# Patient Record
Sex: Female | Born: 1935 | Race: White | Hispanic: No | Smoking: Never smoker
Health system: Southern US, Community
[De-identification: ages and names within clinical notes are randomized; demographics above are authoritative.]

## PROBLEM LIST (undated history)

## (undated) DIAGNOSIS — K5790 Diverticulosis of intestine, part unspecified, without perforation or abscess without bleeding: Secondary | ICD-10-CM

## (undated) DIAGNOSIS — F32A Depression, unspecified: Secondary | ICD-10-CM

## (undated) DIAGNOSIS — H3320 Serous retinal detachment, unspecified eye: Secondary | ICD-10-CM

## (undated) DIAGNOSIS — F329 Major depressive disorder, single episode, unspecified: Secondary | ICD-10-CM

## (undated) DIAGNOSIS — M353 Polymyalgia rheumatica: Secondary | ICD-10-CM

## (undated) DIAGNOSIS — C50919 Malignant neoplasm of unspecified site of unspecified female breast: Secondary | ICD-10-CM

## (undated) DIAGNOSIS — M1990 Unspecified osteoarthritis, unspecified site: Secondary | ICD-10-CM

## (undated) DIAGNOSIS — J189 Pneumonia, unspecified organism: Secondary | ICD-10-CM

## (undated) DIAGNOSIS — I219 Acute myocardial infarction, unspecified: Secondary | ICD-10-CM

## (undated) HISTORY — DX: Acute myocardial infarction, unspecified: I21.9

## (undated) HISTORY — DX: Polymyalgia rheumatica: M35.3

## (undated) HISTORY — DX: Pneumonia, unspecified organism: J18.9

## (undated) HISTORY — DX: Depression, unspecified: F32.A

## (undated) HISTORY — PX: COLONOSCOPY W/ POLYPECTOMY: SHX1380

## (undated) HISTORY — PX: CHOLECYSTECTOMY: SHX55

## (undated) HISTORY — DX: Major depressive disorder, single episode, unspecified: F32.9

## (undated) HISTORY — DX: Diverticulosis of intestine, part unspecified, without perforation or abscess without bleeding: K57.90

## (undated) HISTORY — DX: Malignant neoplasm of unspecified site of unspecified female breast: C50.919

## (undated) HISTORY — DX: Unspecified osteoarthritis, unspecified site: M19.90

## (undated) HISTORY — DX: Serous retinal detachment, unspecified eye: H33.20

---

## 1975-09-10 HISTORY — PX: PARTIAL GASTRECTOMY: SHX2172

## 1985-09-09 DIAGNOSIS — C50919 Malignant neoplasm of unspecified site of unspecified female breast: Secondary | ICD-10-CM

## 1985-09-09 HISTORY — DX: Malignant neoplasm of unspecified site of unspecified female breast: C50.919

## 1985-09-09 HISTORY — PX: OTHER SURGICAL HISTORY: SHX169

## 1998-05-24 ENCOUNTER — Emergency Department (HOSPITAL_COMMUNITY): Admission: EM | Admit: 1998-05-24 | Discharge: 1998-05-24 | Payer: Self-pay | Admitting: Emergency Medicine

## 1998-05-24 ENCOUNTER — Encounter: Payer: Self-pay | Admitting: Emergency Medicine

## 1998-11-15 ENCOUNTER — Ambulatory Visit (HOSPITAL_COMMUNITY): Admission: RE | Admit: 1998-11-15 | Discharge: 1998-11-15 | Payer: Self-pay | Admitting: Rheumatology

## 1998-11-15 ENCOUNTER — Encounter: Payer: Self-pay | Admitting: Rheumatology

## 1998-12-13 ENCOUNTER — Encounter: Payer: Self-pay | Admitting: Emergency Medicine

## 1998-12-13 ENCOUNTER — Emergency Department (HOSPITAL_COMMUNITY): Admission: EM | Admit: 1998-12-13 | Discharge: 1998-12-13 | Payer: Self-pay | Admitting: Emergency Medicine

## 1999-06-14 ENCOUNTER — Encounter: Payer: Self-pay | Admitting: Emergency Medicine

## 1999-06-14 ENCOUNTER — Observation Stay (HOSPITAL_COMMUNITY): Admission: EM | Admit: 1999-06-14 | Discharge: 1999-06-15 | Payer: Self-pay | Admitting: Emergency Medicine

## 1999-06-24 ENCOUNTER — Encounter: Payer: Self-pay | Admitting: Podiatry

## 1999-06-24 ENCOUNTER — Emergency Department (HOSPITAL_COMMUNITY): Admission: EM | Admit: 1999-06-24 | Discharge: 1999-06-24 | Payer: Self-pay | Admitting: *Deleted

## 1999-11-12 ENCOUNTER — Ambulatory Visit (HOSPITAL_COMMUNITY): Admission: RE | Admit: 1999-11-12 | Discharge: 1999-11-12 | Payer: Self-pay | Admitting: Rheumatology

## 1999-11-12 ENCOUNTER — Encounter: Payer: Self-pay | Admitting: Rheumatology

## 1999-12-16 ENCOUNTER — Emergency Department (HOSPITAL_COMMUNITY): Admission: EM | Admit: 1999-12-16 | Discharge: 1999-12-16 | Payer: Self-pay | Admitting: Emergency Medicine

## 1999-12-16 ENCOUNTER — Encounter: Payer: Self-pay | Admitting: Emergency Medicine

## 2000-02-08 ENCOUNTER — Emergency Department (HOSPITAL_COMMUNITY): Admission: EM | Admit: 2000-02-08 | Discharge: 2000-02-08 | Payer: Self-pay | Admitting: Emergency Medicine

## 2000-02-08 ENCOUNTER — Encounter: Payer: Self-pay | Admitting: Emergency Medicine

## 2000-02-27 ENCOUNTER — Encounter: Admission: RE | Admit: 2000-02-27 | Discharge: 2000-02-27 | Payer: Self-pay | Admitting: Rheumatology

## 2000-02-27 ENCOUNTER — Encounter: Payer: Self-pay | Admitting: Rheumatology

## 2000-02-28 ENCOUNTER — Encounter: Admission: RE | Admit: 2000-02-28 | Discharge: 2000-03-24 | Payer: Self-pay | Admitting: Anesthesiology

## 2000-04-09 ENCOUNTER — Encounter: Admission: RE | Admit: 2000-04-09 | Discharge: 2000-07-08 | Payer: Self-pay | Admitting: Anesthesiology

## 2000-06-17 ENCOUNTER — Encounter: Payer: Self-pay | Admitting: Emergency Medicine

## 2000-06-17 ENCOUNTER — Inpatient Hospital Stay (HOSPITAL_COMMUNITY): Admission: EM | Admit: 2000-06-17 | Discharge: 2000-06-20 | Payer: Self-pay | Admitting: Emergency Medicine

## 2000-06-18 ENCOUNTER — Encounter: Payer: Self-pay | Admitting: Internal Medicine

## 2000-07-04 ENCOUNTER — Ambulatory Visit (HOSPITAL_COMMUNITY): Admission: RE | Admit: 2000-07-04 | Discharge: 2000-07-04 | Payer: Self-pay | Admitting: Neurosurgery

## 2000-07-04 ENCOUNTER — Encounter: Payer: Self-pay | Admitting: Internal Medicine

## 2000-07-23 ENCOUNTER — Encounter: Admission: RE | Admit: 2000-07-23 | Discharge: 2000-10-21 | Payer: Self-pay | Admitting: Anesthesiology

## 2000-08-26 ENCOUNTER — Emergency Department (HOSPITAL_COMMUNITY): Admission: EM | Admit: 2000-08-26 | Discharge: 2000-08-26 | Payer: Self-pay | Admitting: Emergency Medicine

## 2000-08-26 ENCOUNTER — Encounter: Payer: Self-pay | Admitting: Emergency Medicine

## 2000-08-30 ENCOUNTER — Emergency Department (HOSPITAL_COMMUNITY): Admission: EM | Admit: 2000-08-30 | Discharge: 2000-08-30 | Payer: Self-pay | Admitting: *Deleted

## 2000-12-11 ENCOUNTER — Encounter: Admission: RE | Admit: 2000-12-11 | Discharge: 2001-03-11 | Payer: Self-pay | Admitting: Anesthesiology

## 2000-12-31 ENCOUNTER — Ambulatory Visit (HOSPITAL_BASED_OUTPATIENT_CLINIC_OR_DEPARTMENT_OTHER): Admission: RE | Admit: 2000-12-31 | Discharge: 2001-01-01 | Payer: Self-pay | Admitting: Plastic Surgery

## 2000-12-31 ENCOUNTER — Encounter (INDEPENDENT_AMBULATORY_CARE_PROVIDER_SITE_OTHER): Payer: Self-pay | Admitting: *Deleted

## 2001-01-08 ENCOUNTER — Ambulatory Visit (HOSPITAL_COMMUNITY): Admission: RE | Admit: 2001-01-08 | Discharge: 2001-01-08 | Payer: Self-pay | Admitting: Anesthesiology

## 2001-01-08 ENCOUNTER — Encounter: Payer: Self-pay | Admitting: Anesthesiology

## 2001-03-02 ENCOUNTER — Encounter: Payer: Self-pay | Admitting: Rheumatology

## 2001-03-02 ENCOUNTER — Encounter: Admission: RE | Admit: 2001-03-02 | Discharge: 2001-03-02 | Payer: Self-pay | Admitting: Rheumatology

## 2001-04-30 ENCOUNTER — Encounter: Admission: RE | Admit: 2001-04-30 | Discharge: 2001-05-09 | Payer: Self-pay | Admitting: Anesthesiology

## 2001-07-15 ENCOUNTER — Encounter: Payer: Self-pay | Admitting: Orthopedic Surgery

## 2001-07-20 ENCOUNTER — Inpatient Hospital Stay (HOSPITAL_COMMUNITY): Admission: RE | Admit: 2001-07-20 | Discharge: 2001-07-23 | Payer: Self-pay | Admitting: Orthopedic Surgery

## 2001-07-23 ENCOUNTER — Inpatient Hospital Stay (HOSPITAL_COMMUNITY)
Admission: RE | Admit: 2001-07-23 | Discharge: 2001-07-29 | Payer: Self-pay | Admitting: Physical Medicine & Rehabilitation

## 2002-02-23 ENCOUNTER — Emergency Department (HOSPITAL_COMMUNITY): Admission: EM | Admit: 2002-02-23 | Discharge: 2002-02-23 | Payer: Self-pay | Admitting: Emergency Medicine

## 2002-03-15 ENCOUNTER — Encounter: Admission: RE | Admit: 2002-03-15 | Discharge: 2002-03-15 | Payer: Self-pay | Admitting: Rheumatology

## 2002-03-15 ENCOUNTER — Encounter: Payer: Self-pay | Admitting: Rheumatology

## 2002-06-15 ENCOUNTER — Emergency Department (HOSPITAL_COMMUNITY): Admission: EM | Admit: 2002-06-15 | Discharge: 2002-06-15 | Payer: Self-pay | Admitting: Emergency Medicine

## 2002-06-16 ENCOUNTER — Encounter: Payer: Self-pay | Admitting: Emergency Medicine

## 2002-06-28 ENCOUNTER — Encounter: Payer: Self-pay | Admitting: Gastroenterology

## 2002-06-28 ENCOUNTER — Ambulatory Visit (HOSPITAL_COMMUNITY): Admission: RE | Admit: 2002-06-28 | Discharge: 2002-06-28 | Payer: Self-pay | Admitting: Gastroenterology

## 2002-10-27 ENCOUNTER — Inpatient Hospital Stay (HOSPITAL_COMMUNITY): Admission: EM | Admit: 2002-10-27 | Discharge: 2002-10-30 | Payer: Self-pay | Admitting: Emergency Medicine

## 2002-10-27 ENCOUNTER — Encounter: Payer: Self-pay | Admitting: Emergency Medicine

## 2002-10-28 ENCOUNTER — Encounter: Payer: Self-pay | Admitting: Internal Medicine

## 2002-11-26 ENCOUNTER — Encounter: Payer: Self-pay | Admitting: Internal Medicine

## 2002-11-26 ENCOUNTER — Inpatient Hospital Stay (HOSPITAL_COMMUNITY): Admission: EM | Admit: 2002-11-26 | Discharge: 2002-12-07 | Payer: Self-pay | Admitting: Internal Medicine

## 2002-12-02 ENCOUNTER — Encounter: Payer: Self-pay | Admitting: Internal Medicine

## 2002-12-03 ENCOUNTER — Encounter: Payer: Self-pay | Admitting: Internal Medicine

## 2002-12-13 ENCOUNTER — Encounter: Payer: Self-pay | Admitting: Emergency Medicine

## 2002-12-13 ENCOUNTER — Inpatient Hospital Stay (HOSPITAL_COMMUNITY): Admission: EM | Admit: 2002-12-13 | Discharge: 2002-12-15 | Payer: Self-pay | Admitting: *Deleted

## 2003-01-02 ENCOUNTER — Inpatient Hospital Stay (HOSPITAL_COMMUNITY): Admission: EM | Admit: 2003-01-02 | Discharge: 2003-01-05 | Payer: Self-pay | Admitting: Emergency Medicine

## 2003-01-02 ENCOUNTER — Encounter: Payer: Self-pay | Admitting: *Deleted

## 2003-04-20 ENCOUNTER — Encounter: Payer: Self-pay | Admitting: Anesthesiology

## 2003-04-20 ENCOUNTER — Ambulatory Visit (HOSPITAL_COMMUNITY): Admission: RE | Admit: 2003-04-20 | Discharge: 2003-04-20 | Payer: Self-pay | Admitting: Anesthesiology

## 2003-06-15 ENCOUNTER — Encounter: Payer: Self-pay | Admitting: Orthopedic Surgery

## 2003-06-20 ENCOUNTER — Ambulatory Visit (HOSPITAL_COMMUNITY): Admission: RE | Admit: 2003-06-20 | Discharge: 2003-06-21 | Payer: Self-pay | Admitting: Orthopedic Surgery

## 2003-07-12 ENCOUNTER — Encounter: Admission: RE | Admit: 2003-07-12 | Discharge: 2003-07-26 | Payer: Self-pay | Admitting: Orthopedic Surgery

## 2003-08-23 ENCOUNTER — Emergency Department (HOSPITAL_COMMUNITY): Admission: EM | Admit: 2003-08-23 | Discharge: 2003-08-24 | Payer: Self-pay

## 2003-09-07 ENCOUNTER — Inpatient Hospital Stay (HOSPITAL_COMMUNITY): Admission: EM | Admit: 2003-09-07 | Discharge: 2003-09-09 | Payer: Self-pay | Admitting: Emergency Medicine

## 2003-09-08 ENCOUNTER — Encounter: Payer: Self-pay | Admitting: Cardiology

## 2003-11-08 HISTORY — PX: SHOULDER SURGERY: SHX246

## 2003-11-14 ENCOUNTER — Inpatient Hospital Stay (HOSPITAL_COMMUNITY): Admission: RE | Admit: 2003-11-14 | Discharge: 2003-11-16 | Payer: Self-pay | Admitting: Orthopedic Surgery

## 2003-12-29 ENCOUNTER — Encounter: Admission: RE | Admit: 2003-12-29 | Discharge: 2004-03-27 | Payer: Self-pay | Admitting: Orthopedic Surgery

## 2004-01-19 ENCOUNTER — Ambulatory Visit (HOSPITAL_COMMUNITY): Admission: RE | Admit: 2004-01-19 | Discharge: 2004-01-19 | Payer: Self-pay | Admitting: Anesthesiology

## 2004-03-08 ENCOUNTER — Encounter: Admission: RE | Admit: 2004-03-08 | Discharge: 2004-03-08 | Payer: Self-pay | Admitting: Internal Medicine

## 2004-04-03 ENCOUNTER — Encounter: Admission: RE | Admit: 2004-04-03 | Discharge: 2004-04-03 | Payer: Self-pay | Admitting: Internal Medicine

## 2004-04-09 HISTORY — PX: BIOPSY THYROID: PRO38

## 2004-04-13 ENCOUNTER — Ambulatory Visit (HOSPITAL_COMMUNITY): Admission: RE | Admit: 2004-04-13 | Discharge: 2004-04-13 | Payer: Self-pay | Admitting: Internal Medicine

## 2004-04-13 ENCOUNTER — Encounter (INDEPENDENT_AMBULATORY_CARE_PROVIDER_SITE_OTHER): Payer: Self-pay | Admitting: *Deleted

## 2004-05-01 ENCOUNTER — Encounter: Admission: RE | Admit: 2004-05-01 | Discharge: 2004-05-01 | Payer: Self-pay | Admitting: Orthopedic Surgery

## 2004-07-23 ENCOUNTER — Ambulatory Visit: Payer: Self-pay | Admitting: Internal Medicine

## 2004-07-26 ENCOUNTER — Ambulatory Visit: Payer: Self-pay | Admitting: Internal Medicine

## 2004-08-03 ENCOUNTER — Ambulatory Visit: Payer: Self-pay | Admitting: Internal Medicine

## 2004-08-09 ENCOUNTER — Ambulatory Visit: Payer: Self-pay | Admitting: Internal Medicine

## 2004-08-30 ENCOUNTER — Ambulatory Visit: Payer: Self-pay | Admitting: Family Medicine

## 2004-08-31 ENCOUNTER — Ambulatory Visit: Payer: Self-pay | Admitting: Internal Medicine

## 2004-08-31 ENCOUNTER — Observation Stay (HOSPITAL_COMMUNITY): Admission: EM | Admit: 2004-08-31 | Discharge: 2004-09-01 | Payer: Self-pay | Admitting: Emergency Medicine

## 2004-09-06 ENCOUNTER — Ambulatory Visit: Payer: Self-pay | Admitting: Internal Medicine

## 2004-09-25 ENCOUNTER — Emergency Department (HOSPITAL_COMMUNITY): Admission: EM | Admit: 2004-09-25 | Discharge: 2004-09-25 | Payer: Self-pay | Admitting: Emergency Medicine

## 2004-10-02 ENCOUNTER — Ambulatory Visit: Payer: Self-pay | Admitting: Endocrinology

## 2004-10-09 ENCOUNTER — Ambulatory Visit: Payer: Self-pay | Admitting: Internal Medicine

## 2004-10-29 ENCOUNTER — Ambulatory Visit: Payer: Self-pay | Admitting: Gastroenterology

## 2004-11-22 ENCOUNTER — Ambulatory Visit: Payer: Self-pay | Admitting: Gastroenterology

## 2004-12-14 ENCOUNTER — Emergency Department (HOSPITAL_COMMUNITY): Admission: EM | Admit: 2004-12-14 | Discharge: 2004-12-14 | Payer: Self-pay | Admitting: Emergency Medicine

## 2004-12-18 ENCOUNTER — Ambulatory Visit: Payer: Self-pay | Admitting: Internal Medicine

## 2004-12-26 ENCOUNTER — Ambulatory Visit: Payer: Self-pay | Admitting: Gastroenterology

## 2005-01-31 ENCOUNTER — Inpatient Hospital Stay (HOSPITAL_COMMUNITY): Admission: EM | Admit: 2005-01-31 | Discharge: 2005-02-05 | Payer: Self-pay | Admitting: Emergency Medicine

## 2005-01-31 ENCOUNTER — Ambulatory Visit: Payer: Self-pay | Admitting: Endocrinology

## 2005-02-05 ENCOUNTER — Encounter: Payer: Self-pay | Admitting: Cardiology

## 2005-02-05 ENCOUNTER — Ambulatory Visit: Payer: Self-pay | Admitting: Cardiology

## 2005-02-12 ENCOUNTER — Ambulatory Visit: Payer: Self-pay | Admitting: Internal Medicine

## 2005-02-13 ENCOUNTER — Ambulatory Visit: Payer: Self-pay | Admitting: Internal Medicine

## 2005-04-02 ENCOUNTER — Ambulatory Visit: Payer: Self-pay | Admitting: Endocrinology

## 2005-04-04 ENCOUNTER — Ambulatory Visit (HOSPITAL_COMMUNITY): Admission: RE | Admit: 2005-04-04 | Discharge: 2005-04-04 | Payer: Self-pay | Admitting: Endocrinology

## 2005-04-17 ENCOUNTER — Ambulatory Visit (HOSPITAL_COMMUNITY): Admission: RE | Admit: 2005-04-17 | Discharge: 2005-04-17 | Payer: Self-pay | Admitting: Anesthesiology

## 2005-04-24 ENCOUNTER — Ambulatory Visit: Payer: Self-pay | Admitting: Internal Medicine

## 2005-05-23 ENCOUNTER — Ambulatory Visit: Payer: Self-pay | Admitting: Hematology and Oncology

## 2005-06-06 ENCOUNTER — Ambulatory Visit (HOSPITAL_COMMUNITY): Admission: RE | Admit: 2005-06-06 | Discharge: 2005-06-06 | Payer: Self-pay | Admitting: Hematology and Oncology

## 2005-06-24 ENCOUNTER — Encounter: Admission: RE | Admit: 2005-06-24 | Discharge: 2005-06-24 | Payer: Self-pay | Admitting: Anesthesiology

## 2005-07-06 ENCOUNTER — Ambulatory Visit: Payer: Self-pay | Admitting: Internal Medicine

## 2005-08-27 ENCOUNTER — Ambulatory Visit: Payer: Self-pay | Admitting: Internal Medicine

## 2005-10-08 ENCOUNTER — Encounter: Admission: RE | Admit: 2005-10-08 | Discharge: 2005-10-08 | Payer: Self-pay | Admitting: Orthopedic Surgery

## 2005-11-18 ENCOUNTER — Ambulatory Visit: Payer: Self-pay | Admitting: Internal Medicine

## 2005-11-18 ENCOUNTER — Inpatient Hospital Stay (HOSPITAL_COMMUNITY): Admission: AD | Admit: 2005-11-18 | Discharge: 2005-11-21 | Payer: Self-pay | Admitting: Internal Medicine

## 2005-11-19 ENCOUNTER — Ambulatory Visit: Payer: Self-pay | Admitting: Gastroenterology

## 2005-11-19 ENCOUNTER — Ambulatory Visit: Payer: Self-pay | Admitting: Emergency Medicine

## 2005-11-29 ENCOUNTER — Ambulatory Visit: Payer: Self-pay | Admitting: Internal Medicine

## 2005-12-02 ENCOUNTER — Ambulatory Visit: Payer: Self-pay | Admitting: Internal Medicine

## 2006-02-07 HISTORY — PX: OTHER SURGICAL HISTORY: SHX169

## 2006-02-21 ENCOUNTER — Ambulatory Visit: Payer: Self-pay | Admitting: Internal Medicine

## 2006-02-25 ENCOUNTER — Inpatient Hospital Stay (HOSPITAL_COMMUNITY): Admission: EM | Admit: 2006-02-25 | Discharge: 2006-02-28 | Payer: Self-pay | Admitting: Emergency Medicine

## 2006-02-27 ENCOUNTER — Ambulatory Visit: Payer: Self-pay | Admitting: Internal Medicine

## 2006-03-04 ENCOUNTER — Ambulatory Visit: Payer: Self-pay | Admitting: Internal Medicine

## 2006-03-06 ENCOUNTER — Ambulatory Visit: Payer: Self-pay | Admitting: Hematology and Oncology

## 2006-03-10 ENCOUNTER — Ambulatory Visit: Payer: Self-pay | Admitting: Internal Medicine

## 2006-03-11 ENCOUNTER — Ambulatory Visit: Payer: Self-pay | Admitting: Internal Medicine

## 2006-03-14 LAB — CBC WITH DIFFERENTIAL/PLATELET
BASO%: 0.5 % (ref 0.0–2.0)
EOS%: 5.9 % (ref 0.0–7.0)
HCT: 33.9 % — ABNORMAL LOW (ref 34.8–46.6)
LYMPH%: 28.4 % (ref 14.0–48.0)
MCH: 30.5 pg (ref 26.0–34.0)
MCHC: 34.2 g/dL (ref 32.0–36.0)
NEUT%: 57.9 % (ref 39.6–76.8)
Platelets: 216 10*3/uL (ref 145–400)

## 2006-03-17 LAB — IMMUNOFIXATION ELECTROPHORESIS
IgA: 123 mg/dL (ref 68–378)
IgG (Immunoglobin G), Serum: 444 mg/dL — ABNORMAL LOW (ref 694–1618)
Total Protein, Serum Electrophoresis: 6.1 g/dL (ref 6.0–8.3)

## 2006-03-17 LAB — BASIC METABOLIC PANEL
Calcium: 8.4 mg/dL (ref 8.4–10.5)
Creatinine, Ser: 1.31 mg/dL — ABNORMAL HIGH (ref 0.40–1.20)

## 2006-03-20 ENCOUNTER — Ambulatory Visit: Payer: Self-pay | Admitting: Internal Medicine

## 2006-04-08 ENCOUNTER — Ambulatory Visit: Payer: Self-pay | Admitting: Internal Medicine

## 2006-05-14 ENCOUNTER — Ambulatory Visit: Payer: Self-pay | Admitting: Internal Medicine

## 2006-05-22 ENCOUNTER — Ambulatory Visit: Payer: Self-pay | Admitting: Internal Medicine

## 2006-05-23 IMAGING — CR DG CERVICAL SPINE COMPLETE 4+V
6 series · 6 of 6 positions shown · non-contrast
Comparison: none

CLINICAL DATA: Patient fell and has pain in left forearm.
 LEFT FOREARM - 2 VIEWS:
 No fractures.
CLINICAL DATA: Patient fell; pain in neck.
 CERVICAL SPINE COMPLETE:
 There is diffuse degenerative change throughout the cervical spine with spurring and loss of joint space and some malalignment, particularly C3-4.  No acute fracture is identified.  There is some diffuse degenerative change.

[view not recorded (1 of 6)]
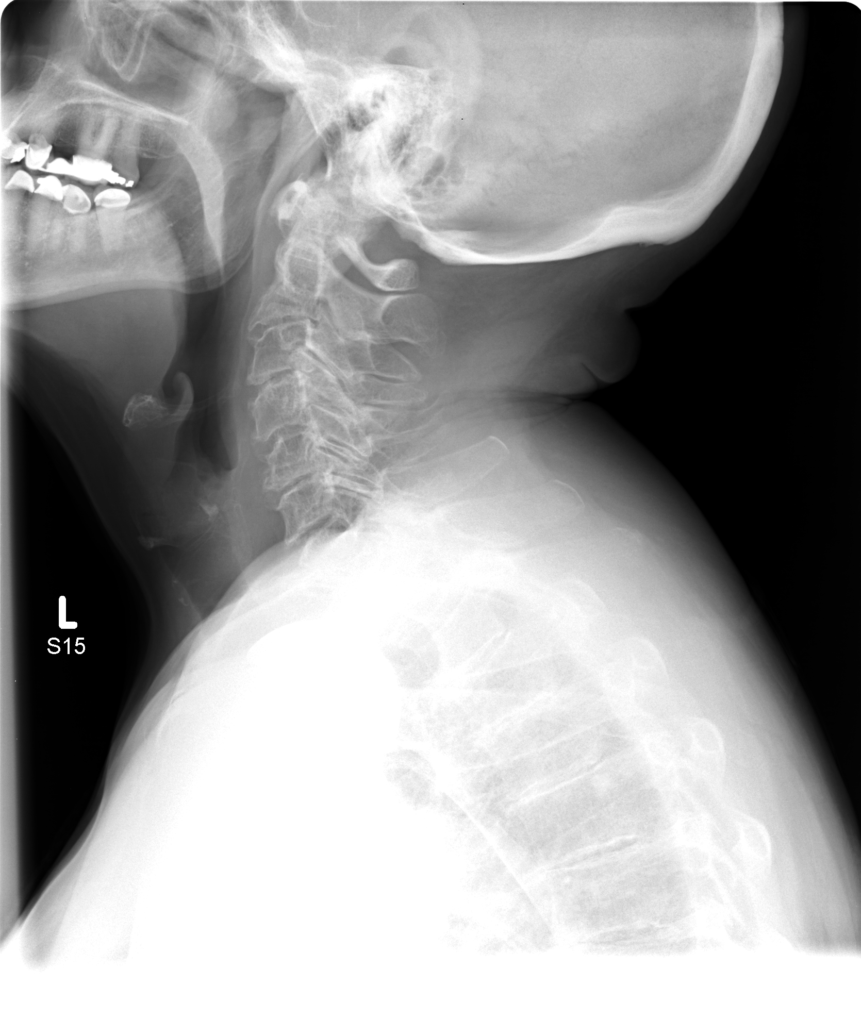

[view not recorded (2 of 6)]
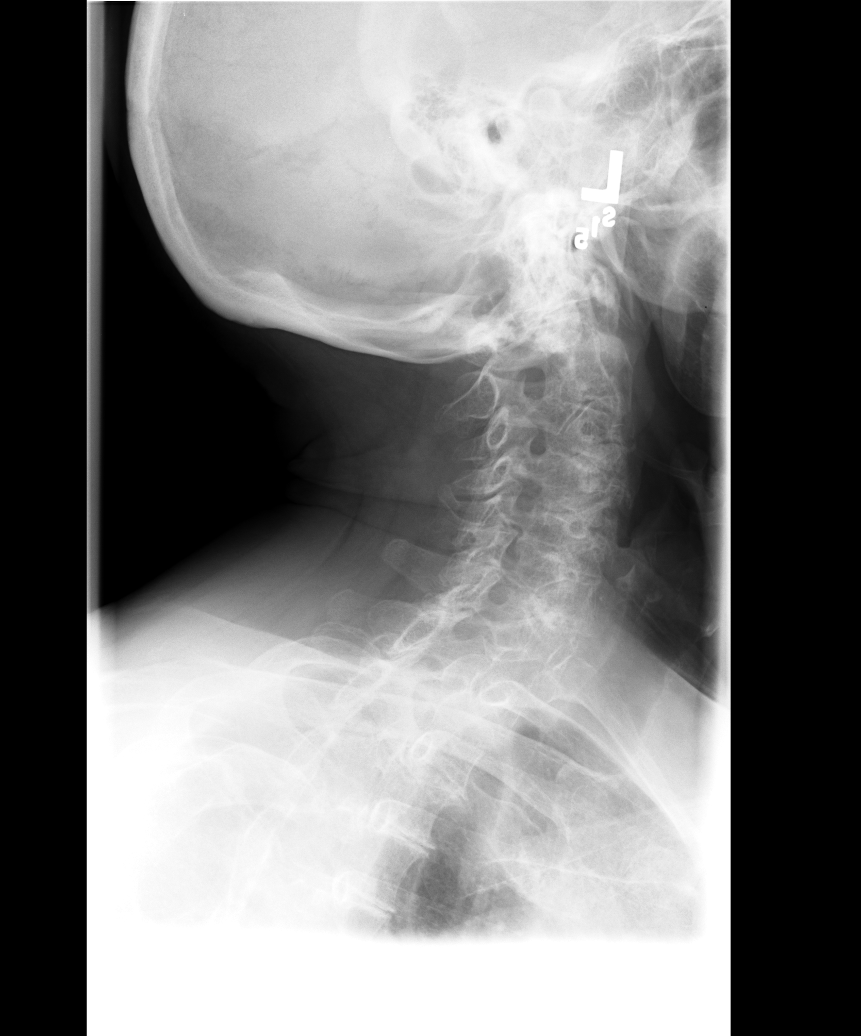

[view not recorded (3 of 6)]
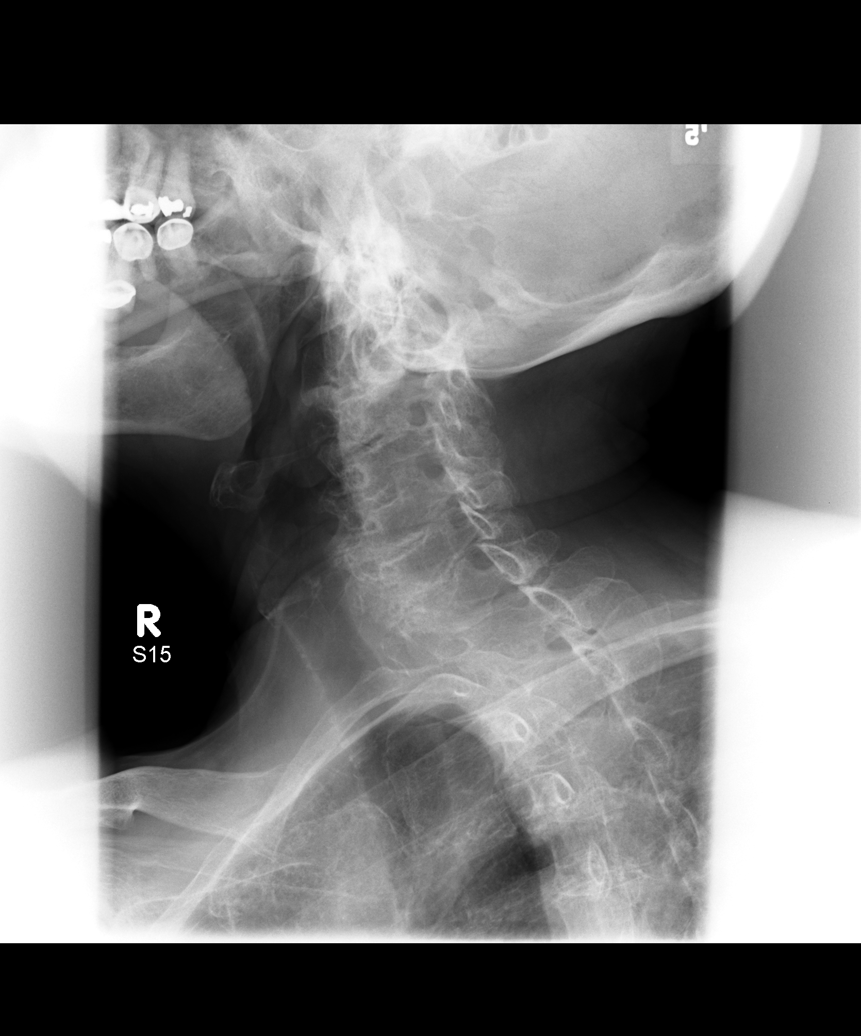

[view not recorded (4 of 6)]
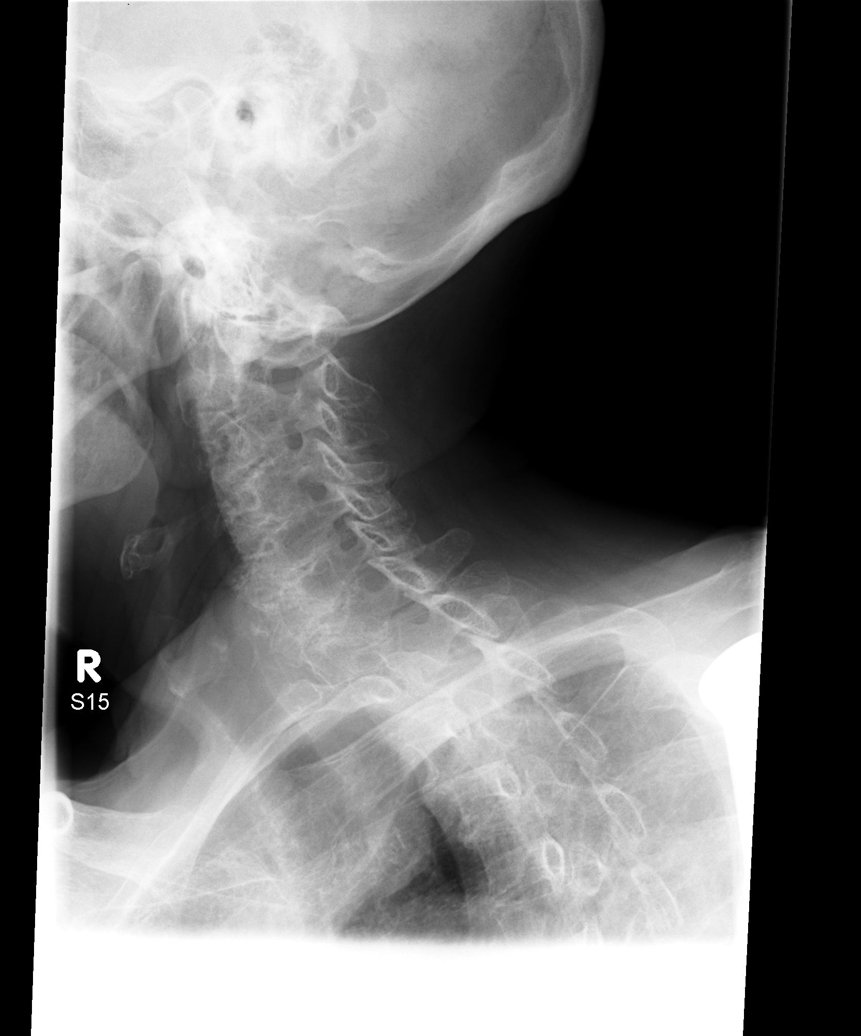

[view not recorded (5 of 6)]
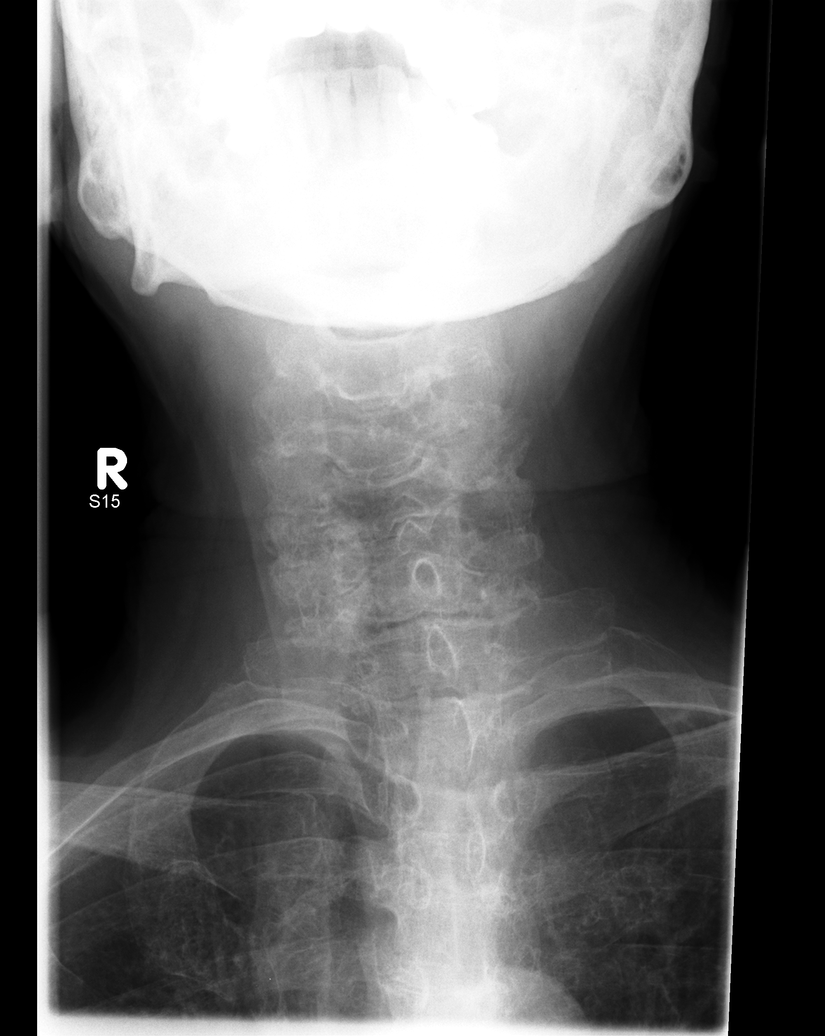

[view not recorded (6 of 6)]
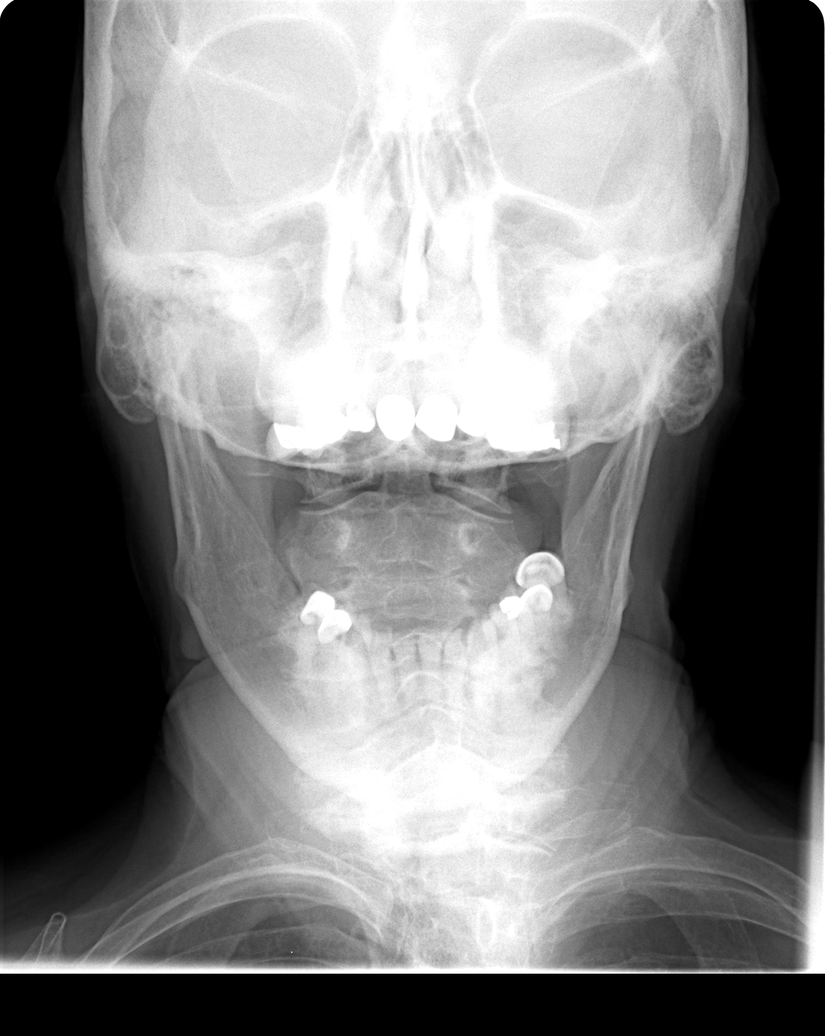

[6 of 6 positions shown; findings below may reference images not displayed]

IMPRESSION: No fracture.
 LEFT HUMERUS:
 Left humeral head prosthesis is present.  No fracture.
IMPRESSION: No acute fracture.
IMPRESSION: No fracture.

## 2006-07-31 ENCOUNTER — Inpatient Hospital Stay (HOSPITAL_COMMUNITY): Admission: EM | Admit: 2006-07-31 | Discharge: 2006-08-01 | Payer: Self-pay | Admitting: Emergency Medicine

## 2006-07-31 ENCOUNTER — Ambulatory Visit: Payer: Self-pay | Admitting: Internal Medicine

## 2006-08-07 ENCOUNTER — Ambulatory Visit: Payer: Self-pay | Admitting: Internal Medicine

## 2006-08-14 ENCOUNTER — Ambulatory Visit: Payer: Self-pay | Admitting: Internal Medicine

## 2006-09-09 DIAGNOSIS — M353 Polymyalgia rheumatica: Secondary | ICD-10-CM

## 2006-09-09 HISTORY — DX: Polymyalgia rheumatica: M35.3

## 2006-10-09 ENCOUNTER — Emergency Department (HOSPITAL_COMMUNITY): Admission: EM | Admit: 2006-10-09 | Discharge: 2006-10-09 | Payer: Self-pay | Admitting: Emergency Medicine

## 2006-12-15 ENCOUNTER — Ambulatory Visit: Payer: Self-pay | Admitting: Hematology and Oncology

## 2006-12-17 LAB — CBC WITH DIFFERENTIAL/PLATELET
BASO%: 0.7 % (ref 0.0–2.0)
Eosinophils Absolute: 0.2 10*3/uL (ref 0.0–0.5)
LYMPH%: 43.8 % (ref 14.0–48.0)
MCHC: 34.9 g/dL (ref 32.0–36.0)
MCV: 85.2 fL (ref 81.0–101.0)
MONO%: 10 % (ref 0.0–13.0)
Platelets: 180 10*3/uL (ref 145–400)
RBC: 4.02 10*6/uL (ref 3.70–5.32)

## 2006-12-18 ENCOUNTER — Ambulatory Visit (HOSPITAL_COMMUNITY): Admission: RE | Admit: 2006-12-18 | Discharge: 2006-12-18 | Payer: Self-pay | Admitting: Hematology and Oncology

## 2006-12-19 LAB — SPEP & IFE WITH QIG
Albumin ELP: 66 % (ref 55.8–66.1)
Beta Globulin: 7.4 % — ABNORMAL HIGH (ref 4.7–7.2)
IgA: 122 mg/dL (ref 68–378)
IgM, Serum: 170 mg/dL (ref 60–263)
Total Protein, Serum Electrophoresis: 6.4 g/dL (ref 6.0–8.3)

## 2006-12-19 LAB — COMPREHENSIVE METABOLIC PANEL
Alkaline Phosphatase: 62 U/L (ref 39–117)
Glucose, Bld: 108 mg/dL — ABNORMAL HIGH (ref 70–99)
Sodium: 141 mEq/L (ref 135–145)
Total Bilirubin: 0.5 mg/dL (ref 0.3–1.2)
Total Protein: 6.4 g/dL (ref 6.0–8.3)

## 2007-01-28 DIAGNOSIS — J4489 Other specified chronic obstructive pulmonary disease: Secondary | ICD-10-CM | POA: Insufficient documentation

## 2007-01-28 DIAGNOSIS — F329 Major depressive disorder, single episode, unspecified: Secondary | ICD-10-CM

## 2007-01-28 DIAGNOSIS — M353 Polymyalgia rheumatica: Secondary | ICD-10-CM

## 2007-01-28 DIAGNOSIS — M1990 Unspecified osteoarthritis, unspecified site: Secondary | ICD-10-CM | POA: Insufficient documentation

## 2007-01-28 DIAGNOSIS — K5730 Diverticulosis of large intestine without perforation or abscess without bleeding: Secondary | ICD-10-CM | POA: Insufficient documentation

## 2007-01-28 DIAGNOSIS — Z9889 Other specified postprocedural states: Secondary | ICD-10-CM

## 2007-01-28 DIAGNOSIS — J449 Chronic obstructive pulmonary disease, unspecified: Secondary | ICD-10-CM

## 2007-02-01 IMAGING — CR DG HUMERUS 2V *L*
3 series · 3 of 3 positions shown · non-contrast
Comparison: none

CLINICAL DATA: Breast carcinoma, back pain, left arm pain.  
 LEFT HUMERUS ? 2 VIEW:
 Left proximal humeral prosthesis identified.  No acute abnormalities or focal lesions identified.  Degenerative changes of the acromioclavicular joint noted.
TECHNIQUE: Whole body anterior and posterior images were obtained approximately 3 hours after intravenous injection of radiopharmaceutical.
 Radiopharmaceutical:  24 mCi Vc-AAm MDP.

[view not recorded (1 of 3)]
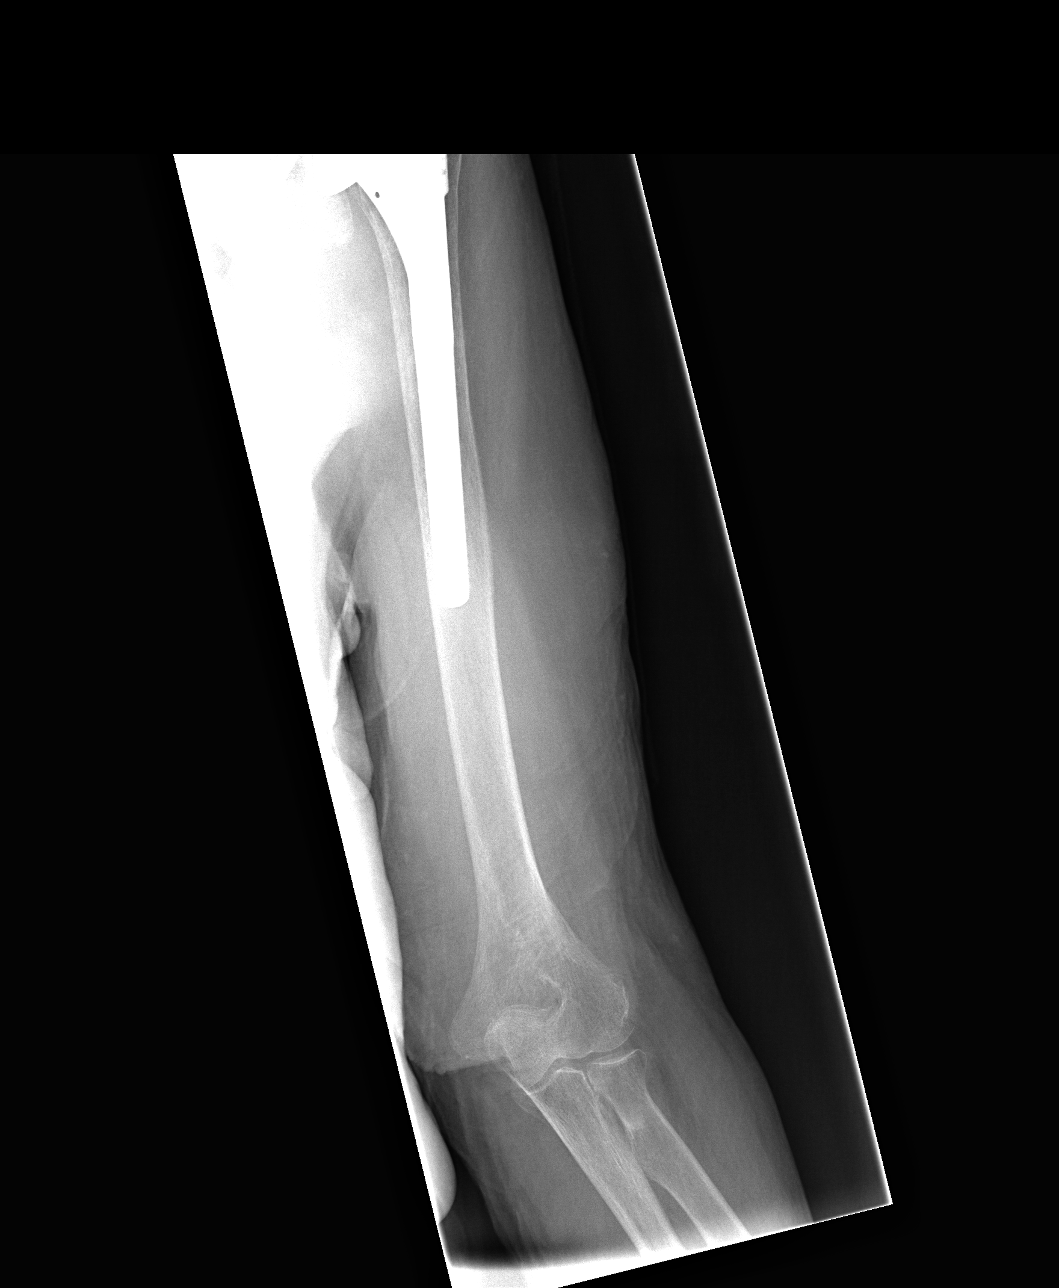

[view not recorded (2 of 3)]
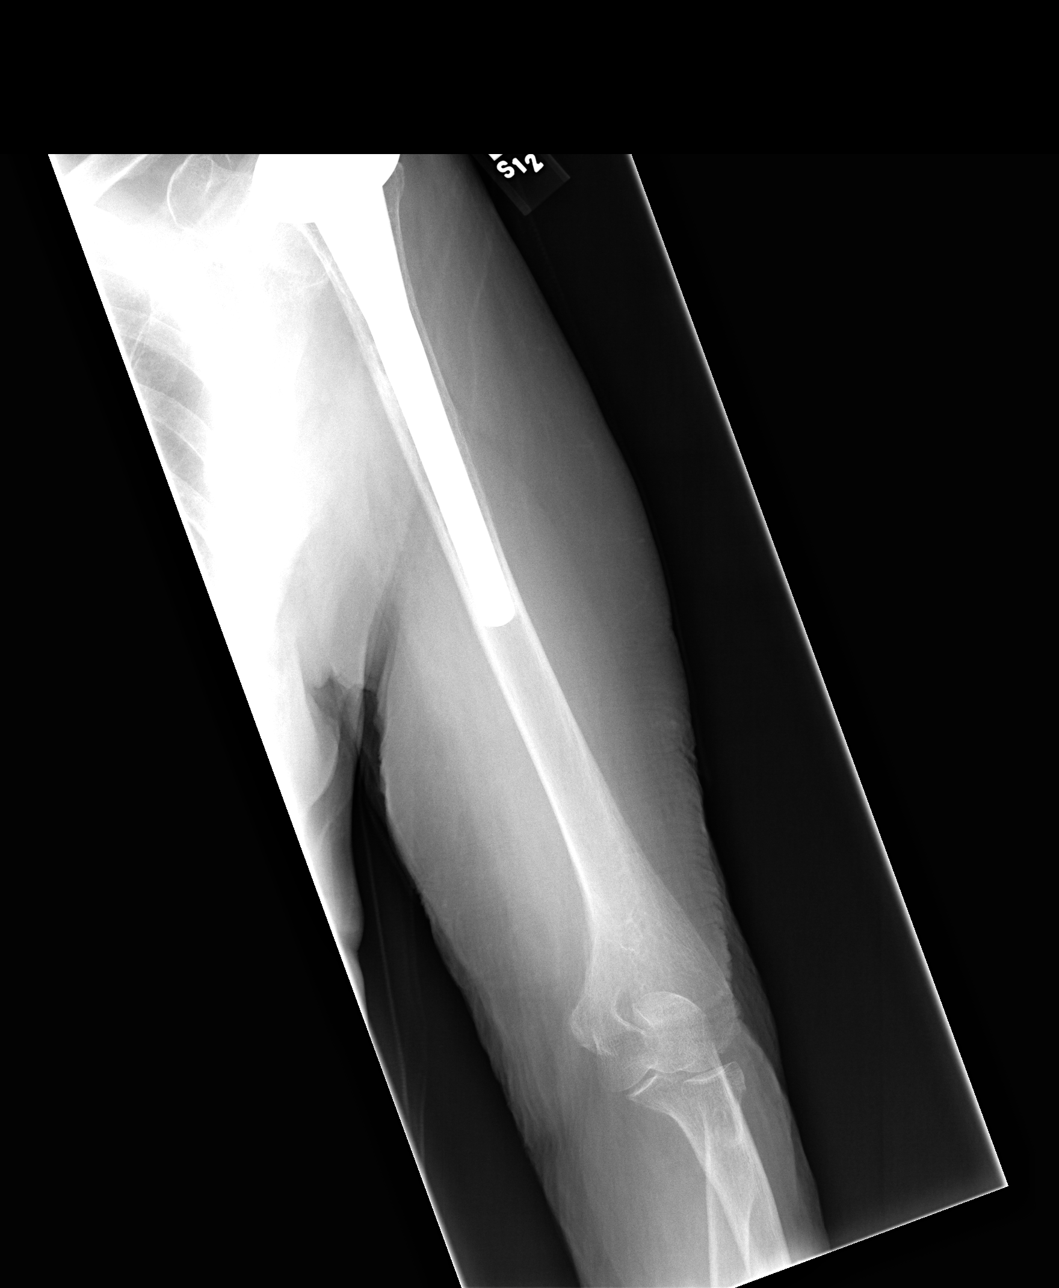

[view not recorded (3 of 3)]
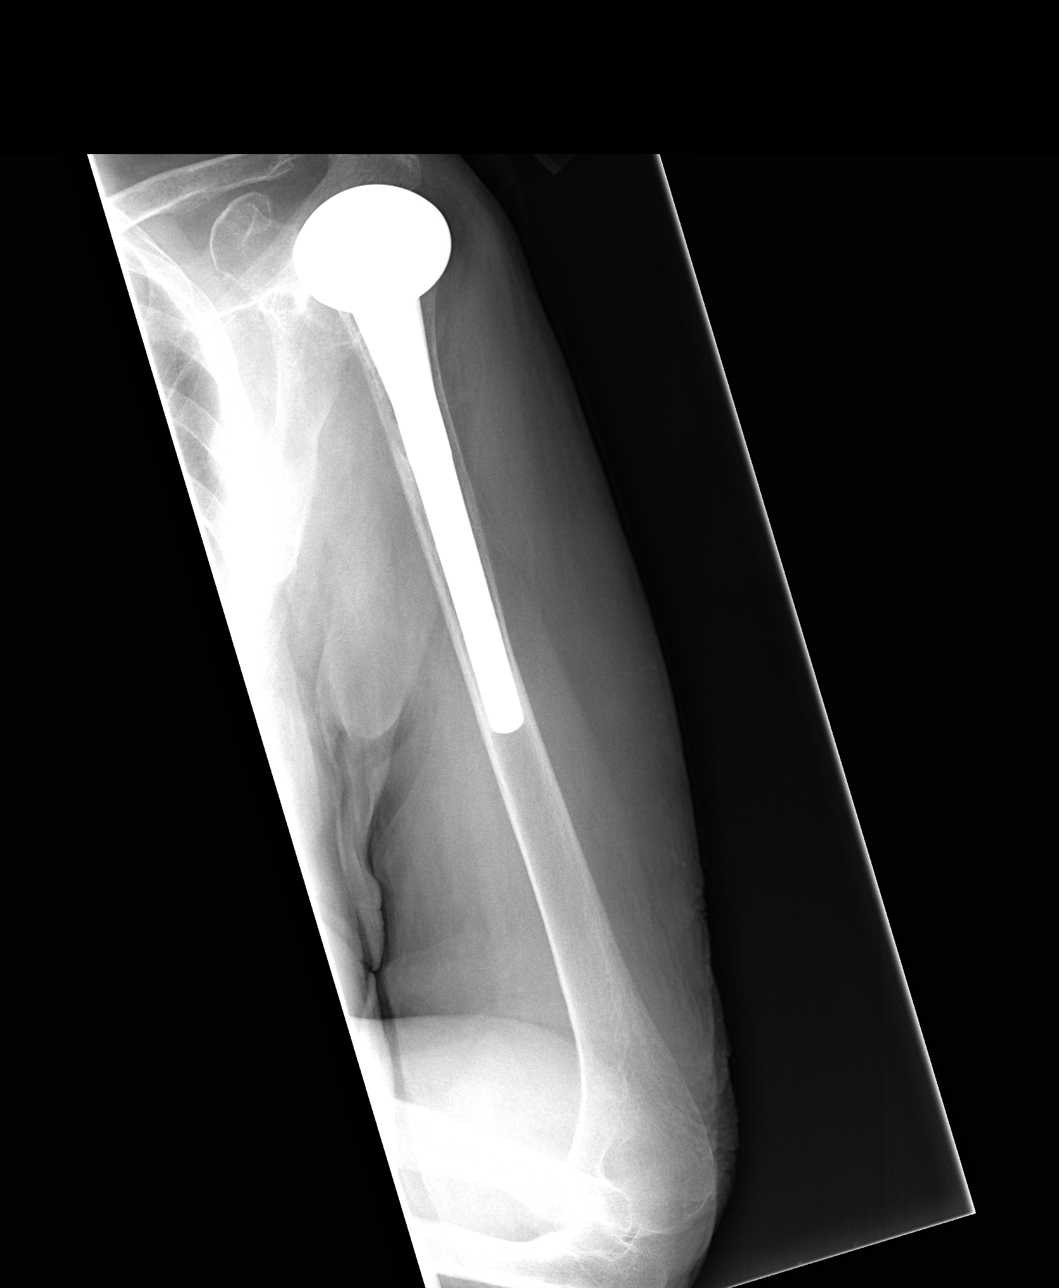

[3 of 3 positions shown; findings below may reference images not displayed]

IMPRESSION: See report.  
 NM WHOLE BODY BONE SCAN:
FINDINGS: Increased activity within the lower neck, bilateral shoulders, left greater than right, lumbar spine, feet, and hands are compatible with degenerative changes/osteoarthritis.  Bilateral knee and left humeral prosthesis are identified.  No abnormal activity is identified to suggest bony metastatic disease.  These findings are similar to prior study performed on 05/01/2004.
IMPRESSION: 1.  No definite bony metastatic disease 
 2  Degenerative changes and hardware as described.

## 2007-03-30 ENCOUNTER — Encounter: Payer: Self-pay | Admitting: Internal Medicine

## 2007-04-10 ENCOUNTER — Ambulatory Visit: Payer: Self-pay | Admitting: Internal Medicine

## 2007-04-10 DIAGNOSIS — J449 Chronic obstructive pulmonary disease, unspecified: Secondary | ICD-10-CM | POA: Insufficient documentation

## 2007-04-15 ENCOUNTER — Encounter: Payer: Self-pay | Admitting: Internal Medicine

## 2007-05-15 ENCOUNTER — Encounter: Payer: Self-pay | Admitting: Internal Medicine

## 2007-05-28 ENCOUNTER — Ambulatory Visit: Payer: Self-pay | Admitting: Internal Medicine

## 2007-05-28 DIAGNOSIS — D649 Anemia, unspecified: Secondary | ICD-10-CM

## 2007-05-28 DIAGNOSIS — R5381 Other malaise: Secondary | ICD-10-CM | POA: Insufficient documentation

## 2007-05-28 DIAGNOSIS — R5383 Other fatigue: Secondary | ICD-10-CM

## 2007-05-30 ENCOUNTER — Encounter: Payer: Self-pay | Admitting: Internal Medicine

## 2007-05-30 LAB — CONVERTED CEMR LAB
Basophils Absolute: 0 10*3/uL (ref 0.0–0.1)
Basophils Relative: 0.2 % (ref 0.0–1.0)
Folate: >20 ng/mL
Lymphocytes Relative: 18.8 % (ref 12.0–46.0)
Monocytes Relative: 4.9 % (ref 3.0–11.0)
Neutro Abs: 3.7 10*3/uL (ref 1.4–7.7)
Platelets: 242 10*3/uL (ref 150–400)
Transferrin: 253.7 mg/dL (ref >=212.0)
Vitamin B-12: 239 pg/mL (ref 211–911)

## 2007-06-01 ENCOUNTER — Encounter (INDEPENDENT_AMBULATORY_CARE_PROVIDER_SITE_OTHER): Payer: Self-pay | Admitting: *Deleted

## 2007-06-04 ENCOUNTER — Ambulatory Visit: Payer: Self-pay | Admitting: Internal Medicine

## 2007-06-05 ENCOUNTER — Encounter (INDEPENDENT_AMBULATORY_CARE_PROVIDER_SITE_OTHER): Payer: Self-pay | Admitting: *Deleted

## 2007-06-17 ENCOUNTER — Encounter: Payer: Self-pay | Admitting: Internal Medicine

## 2007-07-17 ENCOUNTER — Telehealth (INDEPENDENT_AMBULATORY_CARE_PROVIDER_SITE_OTHER): Payer: Self-pay | Admitting: *Deleted

## 2007-07-30 ENCOUNTER — Encounter: Payer: Self-pay | Admitting: Internal Medicine

## 2007-08-28 ENCOUNTER — Ambulatory Visit: Payer: Self-pay | Admitting: Internal Medicine

## 2007-09-01 ENCOUNTER — Encounter: Payer: Self-pay | Admitting: Internal Medicine

## 2007-09-09 ENCOUNTER — Telehealth (INDEPENDENT_AMBULATORY_CARE_PROVIDER_SITE_OTHER): Payer: Self-pay | Admitting: *Deleted

## 2007-09-17 ENCOUNTER — Telehealth (INDEPENDENT_AMBULATORY_CARE_PROVIDER_SITE_OTHER): Payer: Self-pay | Admitting: *Deleted

## 2007-09-24 ENCOUNTER — Ambulatory Visit (HOSPITAL_COMMUNITY): Admission: RE | Admit: 2007-09-24 | Discharge: 2007-09-24 | Payer: Self-pay | Admitting: Anesthesiology

## 2007-09-30 ENCOUNTER — Telehealth (INDEPENDENT_AMBULATORY_CARE_PROVIDER_SITE_OTHER): Payer: Self-pay | Admitting: *Deleted

## 2007-10-27 ENCOUNTER — Telehealth (INDEPENDENT_AMBULATORY_CARE_PROVIDER_SITE_OTHER): Payer: Self-pay | Admitting: *Deleted

## 2007-10-27 ENCOUNTER — Ambulatory Visit: Payer: Self-pay | Admitting: Internal Medicine

## 2007-10-27 DIAGNOSIS — R51 Headache: Secondary | ICD-10-CM

## 2007-10-27 DIAGNOSIS — R948 Abnormal results of function studies of other organs and systems: Secondary | ICD-10-CM

## 2007-10-27 DIAGNOSIS — R519 Headache, unspecified: Secondary | ICD-10-CM | POA: Insufficient documentation

## 2007-10-30 ENCOUNTER — Encounter: Payer: Self-pay | Admitting: Internal Medicine

## 2007-11-03 ENCOUNTER — Ambulatory Visit: Payer: Self-pay | Admitting: Internal Medicine

## 2007-11-06 ENCOUNTER — Encounter (INDEPENDENT_AMBULATORY_CARE_PROVIDER_SITE_OTHER): Payer: Self-pay | Admitting: *Deleted

## 2007-11-10 ENCOUNTER — Telehealth: Payer: Self-pay | Admitting: Internal Medicine

## 2008-02-05 ENCOUNTER — Ambulatory Visit: Payer: Self-pay | Admitting: Internal Medicine

## 2008-02-05 LAB — CONVERTED CEMR LAB
BUN: 17 mg/dL (ref 6–23)
CO2: 28 meq/L (ref 19–32)
Chloride: 105 meq/L (ref 96–112)
Glucose, Bld: 83 mg/dL (ref 70–99)
Potassium: 3.9 meq/L (ref 3.5–5.3)

## 2008-02-07 ENCOUNTER — Telehealth: Payer: Self-pay | Admitting: Internal Medicine

## 2008-02-09 ENCOUNTER — Ambulatory Visit: Payer: Self-pay | Admitting: Cardiology

## 2008-02-10 ENCOUNTER — Telehealth: Payer: Self-pay | Admitting: Internal Medicine

## 2008-02-29 ENCOUNTER — Telehealth (INDEPENDENT_AMBULATORY_CARE_PROVIDER_SITE_OTHER): Payer: Self-pay | Admitting: *Deleted

## 2008-03-08 ENCOUNTER — Ambulatory Visit: Payer: Self-pay | Admitting: Internal Medicine

## 2008-03-08 DIAGNOSIS — R6889 Other general symptoms and signs: Secondary | ICD-10-CM

## 2008-03-08 DIAGNOSIS — K5900 Constipation, unspecified: Secondary | ICD-10-CM | POA: Insufficient documentation

## 2008-03-12 LAB — CONVERTED CEMR LAB
Basophils Relative: 2 % — ABNORMAL HIGH (ref 0.0–1.0)
Eosinophils Relative: 4 % (ref 0.0–5.0)
Free T4: 1 ng/dL (ref 0.6–1.6)
Lymphocytes Relative: 32 % (ref 12.0–46.0)
MCHC: 34.3 g/dL (ref 30.0–36.0)
MCV: 89.6 fL (ref 78.0–100.0)
Monocytes Relative: 9 % (ref 3.0–12.0)
Platelets: 269 10*3/uL (ref 150–400)
RDW: 11.7 % (ref 11.5–14.6)
WBC: 5 10*3/uL (ref 4.5–10.5)

## 2008-03-17 ENCOUNTER — Encounter (INDEPENDENT_AMBULATORY_CARE_PROVIDER_SITE_OTHER): Payer: Self-pay | Admitting: *Deleted

## 2008-03-28 ENCOUNTER — Telehealth (INDEPENDENT_AMBULATORY_CARE_PROVIDER_SITE_OTHER): Payer: Self-pay | Admitting: *Deleted

## 2008-04-11 ENCOUNTER — Telehealth (INDEPENDENT_AMBULATORY_CARE_PROVIDER_SITE_OTHER): Payer: Self-pay | Admitting: *Deleted

## 2008-06-10 ENCOUNTER — Telehealth (INDEPENDENT_AMBULATORY_CARE_PROVIDER_SITE_OTHER): Payer: Self-pay | Admitting: *Deleted

## 2008-06-14 ENCOUNTER — Telehealth (INDEPENDENT_AMBULATORY_CARE_PROVIDER_SITE_OTHER): Payer: Self-pay | Admitting: *Deleted

## 2008-08-03 ENCOUNTER — Telehealth (INDEPENDENT_AMBULATORY_CARE_PROVIDER_SITE_OTHER): Payer: Self-pay | Admitting: *Deleted

## 2008-08-10 ENCOUNTER — Ambulatory Visit: Payer: Self-pay | Admitting: Internal Medicine

## 2008-08-10 DIAGNOSIS — F411 Generalized anxiety disorder: Secondary | ICD-10-CM | POA: Insufficient documentation

## 2008-08-31 ENCOUNTER — Telehealth (INDEPENDENT_AMBULATORY_CARE_PROVIDER_SITE_OTHER): Payer: Self-pay | Admitting: *Deleted

## 2008-09-12 ENCOUNTER — Telehealth (INDEPENDENT_AMBULATORY_CARE_PROVIDER_SITE_OTHER): Payer: Self-pay | Admitting: *Deleted

## 2008-09-13 ENCOUNTER — Telehealth (INDEPENDENT_AMBULATORY_CARE_PROVIDER_SITE_OTHER): Payer: Self-pay | Admitting: *Deleted

## 2008-09-14 ENCOUNTER — Encounter: Payer: Self-pay | Admitting: Internal Medicine

## 2008-11-06 ENCOUNTER — Inpatient Hospital Stay (HOSPITAL_COMMUNITY): Admission: EM | Admit: 2008-11-06 | Discharge: 2008-11-08 | Payer: Self-pay | Admitting: Emergency Medicine

## 2008-11-06 ENCOUNTER — Ambulatory Visit: Payer: Self-pay | Admitting: Cardiology

## 2008-11-17 ENCOUNTER — Ambulatory Visit: Payer: Self-pay | Admitting: Internal Medicine

## 2008-12-01 ENCOUNTER — Ambulatory Visit: Payer: Self-pay | Admitting: Internal Medicine

## 2008-12-01 DIAGNOSIS — J449 Chronic obstructive pulmonary disease, unspecified: Secondary | ICD-10-CM

## 2008-12-01 DIAGNOSIS — J4489 Other specified chronic obstructive pulmonary disease: Secondary | ICD-10-CM | POA: Insufficient documentation

## 2008-12-01 DIAGNOSIS — R262 Difficulty in walking, not elsewhere classified: Secondary | ICD-10-CM | POA: Insufficient documentation

## 2008-12-15 ENCOUNTER — Telehealth (INDEPENDENT_AMBULATORY_CARE_PROVIDER_SITE_OTHER): Payer: Self-pay | Admitting: *Deleted

## 2009-01-10 ENCOUNTER — Telehealth (INDEPENDENT_AMBULATORY_CARE_PROVIDER_SITE_OTHER): Payer: Self-pay | Admitting: *Deleted

## 2009-02-27 ENCOUNTER — Telehealth (INDEPENDENT_AMBULATORY_CARE_PROVIDER_SITE_OTHER): Payer: Self-pay | Admitting: *Deleted

## 2009-03-06 ENCOUNTER — Telehealth (INDEPENDENT_AMBULATORY_CARE_PROVIDER_SITE_OTHER): Payer: Self-pay | Admitting: *Deleted

## 2009-03-16 ENCOUNTER — Ambulatory Visit: Payer: Self-pay | Admitting: Internal Medicine

## 2009-03-18 LAB — CONVERTED CEMR LAB
AST: 21 units/L (ref 0–37)
Alkaline Phosphatase: 44 units/L (ref 39–117)
Bilirubin, Direct: 0.1 mg/dL (ref 0.0–0.3)
Hgb A1c MFr Bld: 5.5 % (ref 4.6–6.5)
Total Bilirubin: 0.9 mg/dL (ref 0.3–1.2)
Total CHOL/HDL Ratio: 2

## 2009-03-22 ENCOUNTER — Encounter (INDEPENDENT_AMBULATORY_CARE_PROVIDER_SITE_OTHER): Payer: Self-pay | Admitting: *Deleted

## 2009-03-28 ENCOUNTER — Telehealth (INDEPENDENT_AMBULATORY_CARE_PROVIDER_SITE_OTHER): Payer: Self-pay | Admitting: *Deleted

## 2009-04-05 ENCOUNTER — Ambulatory Visit: Payer: Self-pay | Admitting: Internal Medicine

## 2009-04-05 DIAGNOSIS — I252 Old myocardial infarction: Secondary | ICD-10-CM | POA: Insufficient documentation

## 2009-04-13 ENCOUNTER — Telehealth (INDEPENDENT_AMBULATORY_CARE_PROVIDER_SITE_OTHER): Payer: Self-pay | Admitting: *Deleted

## 2009-06-01 ENCOUNTER — Telehealth (INDEPENDENT_AMBULATORY_CARE_PROVIDER_SITE_OTHER): Payer: Self-pay | Admitting: *Deleted

## 2009-10-05 ENCOUNTER — Telehealth: Payer: Self-pay | Admitting: Internal Medicine

## 2009-10-05 ENCOUNTER — Emergency Department (HOSPITAL_COMMUNITY): Admission: EM | Admit: 2009-10-05 | Discharge: 2009-10-05 | Payer: Self-pay | Admitting: Emergency Medicine

## 2009-10-30 ENCOUNTER — Telehealth (INDEPENDENT_AMBULATORY_CARE_PROVIDER_SITE_OTHER): Payer: Self-pay | Admitting: *Deleted

## 2009-11-08 ENCOUNTER — Telehealth (INDEPENDENT_AMBULATORY_CARE_PROVIDER_SITE_OTHER): Payer: Self-pay | Admitting: *Deleted

## 2009-11-09 ENCOUNTER — Ambulatory Visit: Payer: Self-pay | Admitting: Vascular Surgery

## 2009-11-09 ENCOUNTER — Encounter (INDEPENDENT_AMBULATORY_CARE_PROVIDER_SITE_OTHER): Payer: Self-pay | Admitting: Internal Medicine

## 2009-11-09 ENCOUNTER — Inpatient Hospital Stay (HOSPITAL_COMMUNITY): Admission: EM | Admit: 2009-11-09 | Discharge: 2009-11-12 | Payer: Self-pay | Admitting: Emergency Medicine

## 2009-11-09 ENCOUNTER — Ambulatory Visit: Payer: Self-pay | Admitting: Cardiology

## 2009-11-16 ENCOUNTER — Observation Stay (HOSPITAL_COMMUNITY): Admission: EM | Admit: 2009-11-16 | Discharge: 2009-11-18 | Payer: Self-pay | Admitting: Emergency Medicine

## 2009-11-16 ENCOUNTER — Telehealth: Payer: Self-pay | Admitting: Internal Medicine

## 2009-11-24 ENCOUNTER — Ambulatory Visit: Payer: Self-pay | Admitting: Internal Medicine

## 2009-11-24 DIAGNOSIS — J189 Pneumonia, unspecified organism: Secondary | ICD-10-CM | POA: Insufficient documentation

## 2009-11-24 DIAGNOSIS — D61818 Other pancytopenia: Secondary | ICD-10-CM | POA: Insufficient documentation

## 2009-11-24 DIAGNOSIS — N189 Chronic kidney disease, unspecified: Secondary | ICD-10-CM | POA: Insufficient documentation

## 2009-11-24 DIAGNOSIS — R079 Chest pain, unspecified: Secondary | ICD-10-CM

## 2009-11-27 LAB — CONVERTED CEMR LAB
Albumin: 3.5 g/dL (ref 3.5–5.2)
BUN: 17 mg/dL (ref 6–23)
Basophils Absolute: 0 10*3/uL (ref 0.0–0.1)
CO2: 31 meq/L (ref 19–32)
Calcium: 8.7 mg/dL (ref 8.4–10.5)
Chloride: 105 meq/L (ref 96–112)
Eosinophils Relative: 3.5 % (ref 0.0–5.0)
GFR calc non Af Amer: 55.14 mL/min (ref >60)
HCT: 35 % — ABNORMAL LOW (ref 36.0–46.0)
Hemoglobin: 11.7 g/dL — ABNORMAL LOW (ref 12.0–15.0)
Lymphs Abs: 0.8 10*3/uL (ref 0.7–4.0)
Monocytes Relative: 6.5 % (ref 3.0–12.0)
Neutro Abs: 2 10*3/uL (ref 1.4–7.7)
RDW: 11.9 % (ref 11.5–14.6)

## 2009-12-03 ENCOUNTER — Emergency Department (HOSPITAL_COMMUNITY): Admission: EM | Admit: 2009-12-03 | Discharge: 2009-12-03 | Payer: Self-pay | Admitting: Emergency Medicine

## 2010-02-06 ENCOUNTER — Telehealth (INDEPENDENT_AMBULATORY_CARE_PROVIDER_SITE_OTHER): Payer: Self-pay | Admitting: *Deleted

## 2010-02-08 ENCOUNTER — Ambulatory Visit: Payer: Self-pay | Admitting: Internal Medicine

## 2010-02-08 DIAGNOSIS — R609 Edema, unspecified: Secondary | ICD-10-CM | POA: Insufficient documentation

## 2010-02-08 DIAGNOSIS — E785 Hyperlipidemia, unspecified: Secondary | ICD-10-CM

## 2010-02-09 ENCOUNTER — Ambulatory Visit: Payer: Self-pay | Admitting: Internal Medicine

## 2010-02-15 LAB — CONVERTED CEMR LAB
Albumin: 3.8 g/dL (ref 3.5–5.2)
CO2: 33 meq/L — ABNORMAL HIGH (ref 19–32)
Calcium: 9 mg/dL (ref 8.4–10.5)
Cholesterol: 156 mg/dL (ref 0–200)
Creatinine, Ser: 1 mg/dL (ref 0.4–1.2)
Glucose, Bld: 87 mg/dL (ref 70–99)
HDL: 71.6 mg/dL (ref >39.00)
Total Protein: 6.1 g/dL (ref 6.0–8.3)
Triglycerides: 70 mg/dL (ref 0.0–149.0)

## 2010-03-28 ENCOUNTER — Telehealth (INDEPENDENT_AMBULATORY_CARE_PROVIDER_SITE_OTHER): Payer: Self-pay | Admitting: *Deleted

## 2010-04-17 ENCOUNTER — Encounter: Payer: Self-pay | Admitting: Internal Medicine

## 2010-09-30 ENCOUNTER — Encounter: Payer: Self-pay | Admitting: Orthopedic Surgery

## 2010-10-09 NOTE — Progress Notes (Signed)
Summary: FYI  going to ED  Phone Note Call from Patient Call back at Home Phone [phone]   Caller: [facility] Summary of Call: SICK WITH THROWING UP AND DIAHHREA SINCE MONDAY---CANT HOLD ANYTHING ON STOMACH--MOUTH DRY---NOW HAS "DRY HEAVES"---NO SLOTS WITH EITHER DOCTOR Initial call taken by: Jerolyn Shin,  October 05, 2009 12:27 PM  Follow-up for Phone Call        pt daughter states that pt c/o weakness, dry vomiting, unable to eat or drink, mouth dryness x4days.  pt daughter advise ED due to possible dehydration. pt daughter ok...................Marland KitchenFelecia Deloach CMA  October 05, 2009 12:34 PM   Additional Follow-up for Phone Call Additional follow up Details #1::        noted Additional Follow-up by: Marga Melnick MD,  October 05, 2009 6:03 PM

## 2010-10-09 NOTE — Assessment & Plan Note (Signed)
Summary: complete home health form - pt bringing meds/cbs   Vital Signs:  Patient profile:   77 year old female Weight:      138.2 pounds Pulse rate:   60 / minute BP sitting:   110 / 60  (left arm) Cuff size:   regular  Vitals Entered By: Shonna Chock (February 08, 2010 2:23 PM) CC: Complete form and refill chlosterol med  Comments REVIEWED MED LIST, PATIENT AGREED DOSE AND INSTRUCTION CORRECT    CC:  Complete form and refill chlosterol med .  History of Present Illness: [name] is here for a med refill & completetion of [facility] form 03/14-05/08/2010. The form contained med duplications(Ex Pravastatin 80 & 40) ; my Nurse reviewed & updated  the  med  list. No specific diet; physically active.  Allergies: 1)  ! Pcn 2)  ! Morphine 3)  ! Aminophylline (Aminophylline) 4)  Biaxin (Clarithromycin) 5)  Morphine Sulfate (Morphine Sulfate) 6)  Oxycodone Hcl (Oxycodone Hcl) 7)  Penicillin G Potassium (Penicillin G Potassium) 8)  * Spiro 9)  * Spiro 10)  Sulfamethoxazole (Sulfamethoxazole) 11)  * Sulfa (Sulfonamides) Group  Review of Systems CV:  Complains of swelling of feet; denies chest pain or discomfort, leg cramps with exertion, and shortness of breath with exertion; Diuretic  D/C ed due to hydration. No excess salt in diet.Not wearing support hose.  Physical Exam  General:  Appears years younger than age,in no acute distress; alert,appropriate and cooperative throughout examination Lungs:  Normal respiratory effort, chest expands symmetrically. Lungs are clear to auscultation, no crackles or wheezes. Heart:  normal rate, regular rhythm, no gallop, no rub, no JVD, and grade 2 /6 systolic murmur  with neck radiation Pulses:  R and L carotid,radial,dorsalis pedis and posterior tibial pulses are full and equal bilaterally Extremities:  No clubbing, cyanosis. Lipedema bilaterally Psych:  Oriented X3, memory intact for recent and remote, normally interactive, and  good eye contact.     Impression & Recommendations:  Problem # 1:  HYPERLIPIDEMIA (ICD-272.4)  Her updated medication list for this problem includes:    Pravastatin Sodium 40 Mg Tabs (Pravastatin sodium) .Marland Kitchen... 1 by mouth at bedtime  Orders: Prescription Created Electronically [phone])  Problem # 2:  EDEMA- LOCALIZED (ICD-782.3)  The following medications were removed from the medication list:    Torsemide 20 Mg Tabs (Torsemide) .Marland Kitchen... Take 1 by mouth daily  Complete Medication List: 1)  Prozac 10 Mg Caps (Fluoxetine hcl) .... 3 by mouth once daily 2)  Norco 7.5-325 Mg Tabs (Hydrocodone-acetaminophen) .Marland Kitchen.. 1 by mouth every 6 hours as needed 3)  Trazodone Hcl 50 Mg Tabs (Trazodone hcl) .... 1/2-2/3 by mouth at bedtime 4)  Methadone Hcl 5 Mg Tabs (Methadone hcl) .Marland Kitchen.. 1 am,  1 pm 5)  Lyrica 25 Mg Caps (Pregabalin) .Marland Kitchen.. 1 by mouth am, 2 by mouth in the evening. patient usually takes 1 am, 1pm 6)  Pravastatin Sodium 40 Mg Tabs (Pravastatin sodium) .Marland Kitchen.. 1 by mouth at bedtime 7)  Bayer Low Strength 81 Mg Tbec (Aspirin) 8)  Lidoderm 5 % Ptch (Lidocaine) .Marland Kitchen.. 1 as needed 9)  Fish Oil 1000 Mg Caps (Omega-3 fatty acids) .Marland Kitchen.. 1 by mouth once daily 10)  Vit D3 4000 Iu  .... Once daily 11)  Womens Mvi/plus Iron  .Marland Kitchen.. 1 by mouth once daily 12)  L-methylfolate Calcium 7.5 Mg Tabs (L-methylfolate) .Marland Kitchen.. 1 by mouth once daily  Patient Instructions: 1)  Schedule fasting Labs; see Diagnoes  for Codes: 2)  Limit your Sodium (Salt) to less than 2 grams a day(slightly less than 1/2 a teaspoon) to prevent fluid retention, swelling, or worsening of symptoms. 3)  BMP ; 4)  Hepatic Panel; 5)  Lipid Panel . Prescriptions: PRAVASTATIN SODIUM 40 MG TABS (PRAVASTATIN SODIUM) 1 by mouth at bedtime  #90 x 3   Entered and Authorized by:   Marga Melnick MD   Signed by:   Marga Melnick MD on 02/08/2010   Method used:   Faxed to ...       [facility]* (retail)       [address]  [place], [place]  [address]       Ph: [phone]       Fax: [phone]   RxID:   [id number]

## 2010-10-09 NOTE — Progress Notes (Signed)
Summary: Refill Request  Phone Note Refill Request Message from:  [facility]arm[facility] [address]. Fax #: [phone]  Refills Requested: Medication #1:  PRAVASTATIN SODIUM 40 MG  TABS (PRAVASTATIN SODIUM) 1 at bedtime   Dosage confirmed as above?Dosage Confirmed   Supply Requested: 3 months   Last Refilled: 10/02/2009  Medication #2:  KLOR-CON 10 10 MEQ  TBCR 2 by mouth once daily   Dosage confirmed as above?Dosage Confirmed   Supply Requested: 1 month   Last Refilled: 10/13/2009 Next Appointment Scheduled: none Initial call taken by: Harold Barban,  November 08, 2009 8:16 AM    New/Updated Medications: * PRAVASTATIN SODIUM 40 MG  TABS (PRAVASTATIN SODIUM) 1 at bedtime (Additional refills require an appointment) KLOR-CON 10 10 MEQ  TBCR (POTASSIUM CHLORIDE) 2 by mouth once daily Prescriptions: KLOR-CON 10 10 MEQ  TBCR (POTASSIUM CHLORIDE) 2 by mouth once daily  #60 x 3   Entered by:   Shonna Chock   Authorized by:   Marga Melnick MD   Signed by:   Shonna Chock on 11/08/2009   Method used:   Faxed to ...       [facility]* (retail)       [address] [place], [place]  [address]       Ph: [phone]       Fax: [phone]   RxID:   [id number] PRAVASTATIN SODIUM 40 MG  TABS (PRAVASTATIN SODIUM) 1 at bedtime (Additional refills require an appointment)  #90 x 0   Entered by:   Shonna Chock   Authorized by:   Marga Melnick MD   Signed by:   Shonna Chock on 11/08/2009   Method used:   Faxed to ...       [facility]* (retail)       [address] [place], [place]  [address]       Ph: [phone]       Fax: [phone]   RxID:   [id number]

## 2010-10-09 NOTE — Progress Notes (Signed)
Summary: [facility]ita Fire pt to ED  Phone Note Call from Patient Call back at Home Phone [phone]   Caller: Patient Summary of Call: Patient daughter states that Mrs Mcullular is trembling all over and has a hard time standing. Wants to know should she take her to the hospital. Patient just got out of the hospital on Sunday. Please advise Initial call taken by: Barb Merino,  November 16, 2009 11:15 AM  Follow-up for Phone Call         pt daughter states that pt was just in ED for chest pain. pt very weak, trembling unable to stand.pt daughter states that pt is eating and drinking but is unable to move her body due to severe weakness.. Advise pt daughter to take pt to ED. pt daughter states that she is unable to move pt so she will call ambulance to come get pt to take her to the hospital.............Marland KitchenFelecia Deloach CMA  November 16, 2009 11:40 AM  Follow-up by: Marga Melnick MD,  November 16, 2009 1:43 PM  Additional Follow-up for Phone Call Additional follow up Details #1::        noted Additional Follow-up by: Marga Melnick MD,  November 16, 2009 1:43 PM

## 2010-10-09 NOTE — Progress Notes (Signed)
Summary: Medication/Paperwork Concerns  Phone Note Outgoing Call Call back at [facility] Phone [phone]   Call placed by: Shonna Chock CMA,  March 28, 2010 9:22 AM Call placed to: Patient Summary of Call: Left message on machine for patient to return call when avaliable, Reason for call:   We received Home Health Plan of Care paperwork from [facility] and the med list they have provided is not correct with our med list and I need to clarify with patient all her medications./Chrae [facility] CMA  March 28, 2010 9:23 AM   Follow-up for Phone Call        I called [facility] and informed them Im waiting for patient to call me back before we sing paperwork and fax back, we need to clarify meds Follow-up by: Shonna Chock CMA,  March 28, 2010 1:33 PM  Additional Follow-up for Phone Call Additional follow up Details #1::        Spoke with a female at the home number he states he will have his wife or mother in law call back. Army Fossa CMA  March 29, 2010 4:26 PM     Additional Follow-up for Phone Call Additional follow up Details #2::    I spoke with Mrs.Behrendt's daughter and she indicated that her mom no longer uses [facility], she did previously but that was a few months ago. I informed her I will contact [facility] for futher f/u tomorrow./Chrae [facility] CMA  April 02, 2010 5:20 PM    I called [facility] and questioned reason for them sending orders from 11/2009 to Dr.Hopper to sign, they said they sent it out to another Dr and was told to send to primary Dr for sig, they said if Dr.Hopper is not willing to sign send it back and they will again forward it to the 1st doctor they sent it to  I then gave the paperwork to Dr.Hopper and discussed with him what [facility] said and he had me print off Dischage instruction from the hospital in March and fax it along with orders to [facility] but did not sign paperwork due to he did not see patient in March 2011./Chrae [facility] CMA  April 03, 2010 10:21 AM

## 2010-10-09 NOTE — Assessment & Plan Note (Signed)
Summary: POST HOSPITAL/[facility]   Vital Signs:  Patient profile:   77 year old female Weight:      137.2 pounds Temp:     97.7 degrees F oral Pulse rate:   64 / minute Resp:     19 per minute BP sitting:   100 / 52  (left arm) Cuff size:   regular  Vitals Entered By: Shonna Chock (November 24, 2009 11:23 AM) CC: 1.) Post Hospital Follow-up and Cough Comments REVIEWED MED LIST, PATIENT AGREED DOSE AND INSTRUCTION CORRECT    CC:  1.) Post Hospital Follow-up and Cough.  History of Present Illness: D/C Summary reviewed:chest pain with  T wave inversions no acute MI. Lingular 11 mm desity; presumed PNA. Dehydration & PCP noted @ [facility].Marland KitchenShe has been asymptomatic  since D/C except for  NP cough.  Allergies: 1)  ! Pcn 2)  ! Morphine 3)  ! Aminophylline (Aminophylline) 4)  Biaxin (Clarithromycin) 5)  Morphine Sulfate (Morphine Sulfate) 6)  Oxycodone Hcl (Oxycodone Hcl) 7)  Penicillin G Potassium (Penicillin G Potassium) 8)  * Spiro 9)  * Spiro 10)  Sulfamethoxazole (Sulfamethoxazole) 11)  * Sulfa (Sulfonamides) Group  Review of Systems General:  Denies chills, fever, and sweats. CV:  Denies chest pain or discomfort and palpitations. Resp:  Complains of cough; denies chest pain with inspiration, coughing up blood, and sputum productive; Minor cough. Neuro:  Complains of headaches; Minor L frontal headache relieved by pain patch.  Physical Exam  General:  Appears younger than age,in no acute distress; alert,appropriate and cooperative throughout examination Lungs:  Normal respiratory effort, chest expands symmetrically. Lungs are clear to auscultation, no crackles or wheezes. Heart:  normal rate, regular rhythm, no gallop, no rub, no JVD, and raspy grade 1  /6 systolic murmur R base.   Extremities:  No clubbing, cyanosis; mild lipedema. Neurologic:  alert & oriented X3.     Impression & Recommendations:  Problem # 1:  CHEST PAIN (ICD-786.50) NTG as needed   Problem # 2:   PNEUMONIA, LEFT (ICD-486) 11 mm lingular infiltrate;clinically resolved  Problem # 3:  RENAL INSUFFICIENCY, ACUTE (ICD-585.9)  Orders: Venipuncture ([address]) TLB-Renal Function Panel (80069-RENAL)  Problem # 4:  PANCYTOPENIA (ICD-284.1)  Orders: TLB-CBC Platelet - w/Differential (85025-CBCD)  Complete Medication List: 1)  Torsemide 20 Mg Tabs (Torsemide) .... Take 1 by mouth daily 2)  Prozac 10 Mg Caps (Fluoxetine hcl) .... 3 by mouth once daily 3)  Norco 7.5-325 Mg Tabs (Hydrocodone-acetaminophen) .... 4-5 x daily 4)  Trazodone Hcl 50 Mg Tabs (Trazodone hcl) .Marland Kitchen.. 1 by mouth qhs 5)  Methadone Hcl 5 Mg Tabs (Methadone hcl) .Marland Kitchen.. 1 am,  1 pm 6)  Lyrica 25 Mg Caps (Pregabalin) .... Bid 7)  Pravastatin Sodium 40 Mg Tabs (pravastatin Sodium)  .Marland Kitchen.. 1 at bedtime (additional refills require an appointment) 8)  Klor-con 10 10 Meq Tbcr (Potassium chloride) .... 2 by mouth once daily 9)  Bayer Low Strength 81 Mg Tbec (Aspirin) 10)  Lidoderm 5 % Ptch (Lidocaine) .Marland Kitchen.. 1 as needed  Patient Instructions: 1)  NTG as needed  as discussed; call if NTG required > 1X /week

## 2010-10-09 NOTE — Progress Notes (Signed)
----   Converted from flag ---- ---- 02/06/2010 10:37 AM, Okey Regal [place] wrote: spoke with patient daughter - appt scheduled [id number]  ---- 02/06/2010 10:37 AM, Harold Barban wrote:   ---- 02/04/2010 8:49 AM, Marga Melnick MD wrote: she needs appt with ALL actual meds; the Home Health form can not be completed as list is inc orrect with wrong doses & duplications ------------------------------

## 2010-10-09 NOTE — Miscellaneous (Signed)
Summary: [facility] Plan/[facility]  Care Plan/[facility]   Imported By: Lanelle Bal 02/15/2010 14:07:10  _____________________________________________________________________  External Attachment:    Type:   Image     Comment:   External Document

## 2010-10-09 NOTE — Miscellaneous (Signed)
Summary: Care Plan/[facility]  Care Plan/[facility]   Imported By: Lanelle Bal 04/23/2010 12:12:36  _____________________________________________________________________  External Attachment:    Type:   Image     Comment:   External Document

## 2010-10-09 NOTE — Letter (Signed)
Summary: [facility]   Imported By: Lanelle Bal 04/23/2010 12:13:39  _____________________________________________________________________  External Attachment:    Type:   Image     Comment:   External Document

## 2010-10-09 NOTE — Progress Notes (Signed)
Summary: refill  Phone Note Refill Request Message from:  Fax from Pharmacy on October 30, 2009 2:19 PM  Refills Requested: Medication #1:  TORSEMIDE 20 MG  TABS TAKE 1 BY MOUTH DAILY [facility] [address] fax [phone]   Method Requested: Fax to Local Pharmacy Next Appointment Scheduled: no appt Initial call taken by: Barb Merino,  October 30, 2009 2:19 PM    Prescriptions: TORSEMIDE 20 MG  TABS (TORSEMIDE) TAKE 1 BY MOUTH DAILY  #30 Tablet x 5   Entered by:   Shonna Chock   Authorized by:   Marga Melnick MD   Signed by:   Shonna Chock on 10/30/2009   Method used:   Electronically to        [facility]* (retail)       [address] [place], [place]  [address]       Ph: [phone]       Fax: [phone]   RxID:   [id number]

## 2010-11-25 LAB — CBC
HCT: 36.1 % (ref 36.0–46.0)
Hemoglobin: 12.1 g/dL (ref 12.0–15.0)
MCHC: 33.6 g/dL (ref 30.0–36.0)
MCV: 89.4 fL (ref 78.0–100.0)
Platelets: 152 K/uL (ref 150–400)
RBC: 4.03 MIL/uL (ref 3.87–5.11)
RDW: 12.1 % (ref 11.5–15.5)
WBC: 2.9 K/uL — ABNORMAL LOW (ref 4.0–10.5)

## 2010-11-25 LAB — COMPREHENSIVE METABOLIC PANEL WITH GFR
ALT: 21 U/L (ref 0–35)
AST: 22 U/L (ref 0–37)
Albumin: 3.6 g/dL (ref 3.5–5.2)
Alkaline Phosphatase: 52 U/L (ref 39–117)
BUN: 19 mg/dL (ref 6–23)
CO2: 26 meq/L (ref 19–32)
Calcium: 8.6 mg/dL (ref 8.4–10.5)
Chloride: 105 meq/L (ref 96–112)
Creatinine, Ser: 1.03 mg/dL (ref 0.4–1.2)
GFR calc non Af Amer: 50 mL/min — ABNORMAL LOW
Glucose, Bld: 84 mg/dL (ref 70–99)
Potassium: 4.1 meq/L (ref 3.5–5.1)
Sodium: 139 meq/L (ref 135–145)
Total Bilirubin: 0.8 mg/dL (ref 0.3–1.2)
Total Protein: 6.1 g/dL (ref 6.0–8.3)

## 2010-11-25 LAB — URINALYSIS, ROUTINE W REFLEX MICROSCOPIC
Bilirubin Urine: NEGATIVE
Glucose, UA: NEGATIVE mg/dL
Hgb urine dipstick: NEGATIVE
Ketones, ur: NEGATIVE mg/dL
Nitrite: NEGATIVE
Protein, ur: NEGATIVE mg/dL
Specific Gravity, Urine: 1.012 (ref 1.005–1.030)
Urobilinogen, UA: 1 mg/dL (ref 0.0–1.0)
pH: 6.5 (ref 5.0–8.0)

## 2010-11-25 LAB — DIFFERENTIAL
Eosinophils Absolute: 0 10*3/uL (ref 0.0–0.7)
Eosinophils Relative: 1 % (ref 0–5)
Lymphs Abs: 0.7 10*3/uL (ref 0.7–4.0)
Monocytes Absolute: 0.1 10*3/uL (ref 0.1–1.0)

## 2010-11-25 LAB — URINE CULTURE: Colony Count: 50000

## 2010-11-30 LAB — BASIC METABOLIC PANEL
BUN: 14 mg/dL (ref 6–23)
BUN: 20 mg/dL (ref 6–23)
BUN: 23 mg/dL (ref 6–23)
CO2: 28 mEq/L (ref 19–32)
Calcium: 8.9 mg/dL (ref 8.4–10.5)
Chloride: 103 mEq/L (ref 96–112)
Chloride: 107 mEq/L (ref 96–112)
Creatinine, Ser: 1.13 mg/dL (ref 0.4–1.2)
GFR calc non Af Amer: 45 mL/min — ABNORMAL LOW (ref >60)
GFR calc non Af Amer: 48 mL/min — ABNORMAL LOW (ref >60)
Glucose, Bld: 89 mg/dL (ref 70–99)
Glucose, Bld: 99 mg/dL (ref 70–99)
Potassium: 3.6 mEq/L (ref 3.5–5.1)
Potassium: 4.3 mEq/L (ref 3.5–5.1)
Potassium: 4.9 mEq/L (ref 3.5–5.1)
Sodium: 136 mEq/L (ref 135–145)
Sodium: 140 mEq/L (ref 135–145)
Sodium: 142 mEq/L (ref 135–145)
Sodium: 143 mEq/L (ref 135–145)

## 2010-11-30 LAB — CBC
HCT: 30.8 % — ABNORMAL LOW (ref 36.0–46.0)
HCT: 32.9 % — ABNORMAL LOW (ref 36.0–46.0)
HCT: 33.1 % — ABNORMAL LOW (ref 36.0–46.0)
HCT: 34.7 % — ABNORMAL LOW (ref 36.0–46.0)
Hemoglobin: 10.1 g/dL — ABNORMAL LOW (ref 12.0–15.0)
Hemoglobin: 10.9 g/dL — ABNORMAL LOW (ref 12.0–15.0)
Hemoglobin: 11.6 g/dL — ABNORMAL LOW (ref 12.0–15.0)
Hemoglobin: 11.7 g/dL — ABNORMAL LOW (ref 12.0–15.0)
MCHC: 33.8 g/dL (ref 30.0–36.0)
MCV: 89.5 fL (ref 78.0–100.0)
MCV: 89.7 fL (ref 78.0–100.0)
MCV: 92.3 fL (ref 78.0–100.0)
Platelets: 130 10*3/uL — ABNORMAL LOW (ref 150–400)
Platelets: 166 10*3/uL (ref 150–400)
Platelets: 177 10*3/uL (ref 150–400)
Platelets: 194 10*3/uL (ref 150–400)
RBC: 3.72 MIL/uL — ABNORMAL LOW (ref 3.87–5.11)
RDW: 11.3 % — ABNORMAL LOW (ref 11.5–15.5)
RDW: 12.1 % (ref 11.5–15.5)
RDW: 12.3 % (ref 11.5–15.5)
WBC: 2.4 10*3/uL — ABNORMAL LOW (ref 4.0–10.5)
WBC: 3.1 10*3/uL — ABNORMAL LOW (ref 4.0–10.5)
WBC: 4.7 10*3/uL (ref 4.0–10.5)
WBC: 6.3 10*3/uL (ref 4.0–10.5)
WBC: 7 10*3/uL (ref 4.0–10.5)

## 2010-11-30 LAB — COMPREHENSIVE METABOLIC PANEL
ALT: 13 U/L (ref 0–35)
Albumin: 3.6 g/dL (ref 3.5–5.2)
Alkaline Phosphatase: 54 U/L (ref 39–117)
BUN: 18 mg/dL (ref 6–23)
Chloride: 105 mEq/L (ref 96–112)
Glucose, Bld: 97 mg/dL (ref 70–99)
Potassium: 3.6 mEq/L (ref 3.5–5.1)
Sodium: 142 mEq/L (ref 135–145)
Total Bilirubin: 0.6 mg/dL (ref 0.3–1.2)
Total Protein: 5.7 g/dL — ABNORMAL LOW (ref 6.0–8.3)

## 2010-11-30 LAB — CARDIAC PANEL(CRET KIN+CKTOT+MB+TROPI)
CK, MB: 1.2 ng/mL (ref 0.3–4.0)
CK, MB: 1.3 ng/mL (ref 0.3–4.0)
CK, MB: 1.4 ng/mL (ref 0.3–4.0)
Relative Index: INVALID (ref 0.0–2.5)
Total CK: 56 U/L (ref 7–177)
Total CK: 65 U/L (ref 7–177)
Troponin I: 0.04 ng/mL (ref 0.00–0.06)
Troponin I: 0.04 ng/mL (ref 0.00–0.06)

## 2010-11-30 LAB — POCT CARDIAC MARKERS
CKMB, poc: <1 ng/mL — ABNORMAL LOW (ref 1.0–8.0)
CKMB, poc: <1 ng/mL — ABNORMAL LOW (ref 1.0–8.0)
Troponin i, poc: <0.05 ng/mL (ref 0.00–0.09)
Troponin i, poc: <0.05 ng/mL (ref 0.00–0.09)

## 2010-11-30 LAB — DIFFERENTIAL
Basophils Absolute: 0 10*3/uL (ref 0.0–0.1)
Basophils Relative: 1 % (ref 0–1)
Eosinophils Absolute: 0 10*3/uL (ref 0.0–0.7)
Eosinophils Absolute: 0 10*3/uL (ref 0.0–0.7)
Eosinophils Relative: 0 % (ref 0–5)
Lymphs Abs: 0.7 10*3/uL (ref 0.7–4.0)
Monocytes Absolute: 0.3 10*3/uL (ref 0.1–1.0)
Monocytes Relative: 5 % (ref 3–12)
Neutro Abs: 2.5 10*3/uL (ref 1.7–7.7)
Neutrophils Relative %: 71 % (ref 43–77)

## 2010-11-30 LAB — APTT: aPTT: 23 seconds — ABNORMAL LOW (ref 24–37)

## 2010-11-30 LAB — URINALYSIS, ROUTINE W REFLEX MICROSCOPIC
Bilirubin Urine: NEGATIVE
Hgb urine dipstick: NEGATIVE
Specific Gravity, Urine: 1.008 (ref 1.005–1.030)
pH: 5.5 (ref 5.0–8.0)

## 2010-11-30 LAB — LIPID PANEL
HDL: 69 mg/dL (ref >39)
Total CHOL/HDL Ratio: 2.2 RATIO
Triglycerides: 97 mg/dL (ref <150)
VLDL: 19 mg/dL (ref 0–40)

## 2010-11-30 LAB — MRSA PCR SCREENING: MRSA by PCR: NEGATIVE

## 2010-11-30 LAB — D-DIMER, QUANTITATIVE

## 2010-11-30 LAB — TSH: TSH: 0.927 u[IU]/mL (ref 0.350–4.500)

## 2010-12-20 LAB — BASIC METABOLIC PANEL
BUN: 19 mg/dL (ref 6–23)
CO2: 29 mEq/L (ref 19–32)
Calcium: 8.2 mg/dL — ABNORMAL LOW (ref 8.4–10.5)
Chloride: 109 mEq/L (ref 96–112)
Creatinine, Ser: 0.92 mg/dL (ref 0.4–1.2)
GFR calc Af Amer: >60 mL/min (ref >60)
Glucose, Bld: 85 mg/dL (ref 70–99)

## 2010-12-20 LAB — HEPATIC FUNCTION PANEL
Alkaline Phosphatase: 53 U/L (ref 39–117)
Bilirubin, Direct: 0.1 mg/dL (ref 0.0–0.3)
Indirect Bilirubin: 0.3 mg/dL (ref 0.3–0.9)
Total Bilirubin: 0.4 mg/dL (ref 0.3–1.2)

## 2010-12-25 LAB — CBC
Hemoglobin: 10.7 g/dL — ABNORMAL LOW (ref 12.0–15.0)
MCHC: 33.5 g/dL (ref 30.0–36.0)
MCV: 89.7 fL (ref 78.0–100.0)
RBC: 3.57 MIL/uL — ABNORMAL LOW (ref 3.87–5.11)
RDW: 12.8 % (ref 11.5–15.5)

## 2010-12-25 LAB — DIFFERENTIAL
Basophils Absolute: 0 10*3/uL (ref 0.0–0.1)
Basophils Relative: 1 % (ref 0–1)
Lymphocytes Relative: 16 % (ref 12–46)
Monocytes Relative: 8 % (ref 3–12)
Neutro Abs: 3.3 10*3/uL (ref 1.7–7.7)
Neutrophils Relative %: 73 % (ref 43–77)

## 2010-12-25 LAB — BASIC METABOLIC PANEL
CO2: 30 mEq/L (ref 19–32)
Calcium: 8.2 mg/dL — ABNORMAL LOW (ref 8.4–10.5)
Chloride: 104 mEq/L (ref 96–112)
Creatinine, Ser: 1.84 mg/dL — ABNORMAL HIGH (ref 0.4–1.2)
GFR calc Af Amer: 31 mL/min — ABNORMAL LOW (ref >60)
Glucose, Bld: 93 mg/dL (ref 70–99)

## 2010-12-25 LAB — URINALYSIS, ROUTINE W REFLEX MICROSCOPIC
Ketones, ur: NEGATIVE mg/dL
Protein, ur: NEGATIVE mg/dL
Urobilinogen, UA: 0.2 mg/dL (ref 0.0–1.0)

## 2010-12-25 LAB — CK TOTAL AND CKMB (NOT AT ARMC)
CK, MB: 2.7 ng/mL (ref 0.3–4.0)
Relative Index: 1.2 (ref 0.0–2.5)

## 2010-12-25 LAB — MAGNESIUM: Magnesium: 2.3 mg/dL (ref 1.5–2.5)

## 2010-12-25 LAB — TSH: TSH: 1.62 u[IU]/mL (ref 0.350–4.500)

## 2010-12-25 LAB — URINE MICROSCOPIC-ADD ON

## 2011-01-22 NOTE — H&P (Signed)
NAMEHAILY, [name]              ACCOUNT NO.:  [id number]   MEDICAL RECORD NO.:  [id number]          PATIENT TYPE:  INP   LOCATION:  1415                         FACILITY:  [facility]   PHYSICIAN:  Darryl D. Prime, MD    DATE OF BIRTH:  04/28/1916   DATE OF ADMISSION:  11/06/2008  DATE OF DISCHARGE:                              HISTORY & PHYSICAL   PRIMARY CARE PHYSICIAN:  Titus Dubin. [name], M.D.   CHIEF COMPLAINT:  Larey Seat.   HISTORY OF PRESENT ILLNESS:  [name] is a 77 year old female with a  history of degenerative joint disease and severe, on pain medications,  history of breast cancer, status post bilateral mastectomies, history of  myocardial infarction with nonobstructive coronary disease felt due to  severe coronary spasm, history of paroxysmal atrial fibrillation,  history of asthma, who was walking in the kitchen today and fell.  She  notes she had not tripped.  She notes no dizziness prior.  She just got  weak and fell.  Her daughter notes she has been experiencing weakness  all week.  The patient denies any chest pain or worsening shortness of  breath.  She has had no change in her medications for many months.  In  the emergency room it was noted that she appeared significantly  hypovolemic and was given gentle hydration.  She has had no fever.  She  notes no change in her diet habits.  She did fall and hit her head when  she fell, hit the back of her head and minor laceration stapled in the  emergency room.   PAST MEDICAL/SURGICAL HISTORY:  As above.  1. 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  2. History of chronic pain, lumbar and cervical.  3. History of COPD.  4. History of coronary vascular spasm as above.  5. History of atrial fibrillation, not a Coumadin candidate.  6. History of breast cancer, status post bilateral mastectomies.  7. Status post cholecystectomy.  8. Status post appendectomy.  9. History of past gastrectomy and peptic ulcer disease.  10.History of left shoulder surgery.  11.She has a history of a total knee arthroplasty.   ALLERGIES:  1. MORPHINE.  2. PENICILLIN.  3. MOXIFLOXACIN.   MEDICATIONS:  1. She is on Pravastatin 80 mg daily.  2. Methadone 5 mg twice a day.  3. Torsemide 20 mg daily.  4. Trazodone 50 mg daily.  5. Aspirin 80 mg daily.  6. Hydrocodone/acetaminophen p.r.n.  7. Fortical 50 mcg daily.  8. Flovent p.r.n.  9. Serevent p.r.n.  10.Possibly fluoxetine.  11.Potassium chloride 20 mEq .  She cannot confirm this medication      list, however.  12.Methadone.  13.Possibly on Lyrica, 25 mg a day.   FAMILY HISTORY:  Grandson had melanoma.   SOCIAL HISTORY:  She is widowed, lives with her daughter.  No alcohol or  tobacco currently.   PHYSICAL EXAMINATION:  VITAL SIGNS:  Blood pressure is 169/55, pulse of  62, respiratory rate of 60. Saturations are 98% on room air.  Temperature is 92.7.  GENERAL:  In general  she is a chronically ill-appearing female lying  flat in the bed, dreary, drowsy.  HEENT:  Normocephalic.  She has a  minor laceration in the occiput that  was stapled.  There appeared to be no significant swelling associated  with it.  No active bleeding.  Pupils equal, round, and react to light.  Extraocular movements intact.  The patient's oropharynx is very dry.  NECK:  Supple with no lymphadenopathy or thyromegaly.  No carotid  bruits.  LUNGS:  Clear to auscultation bilaterally.  CARDIOVASCULAR:  Regular rhythm and rate.  ABDOMEN:  Soft, nontender, nondistended.  No hepatosplenomegaly.  EXTREMITIES:  Show no clubbing, cyanosis.  She had 3+ lower extremity  edema.  NEUROLOGIC:  She is alert and oriented x4.  Cranial nerves II-XII  grossly intact.  Strength is 4-/5 throughout.  Normal sensation.  SKIN:  Shows no rashes.   LABORATORY DATA:  White count is 4.6, hemoglobin 10.7, hematocrit 32,  platelets 173.  Segs are 73%.  Sodium 141 with potassium of 4.5,  chloride 104, bicarb 30,  BUN 39, creatinine 1.8 increased from 1.5 in  November of 2007.  Glucose was 93.  Urinalysis negative except for trace  leukocytes.   CT of the head showed no acute findings of stable atrophy.  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   ASSESSMENT/PLAN:  This is a patient with a history of chronic pain on  methadone, also possibly on Lyrica 25 mg a day.  On Torsemide for lower  extremity edema.  EKG shows bifascicular block.  She now has had a fall  that may be multifactorial but some of the potential etiologies could  include weakness from being on high dose Torsemide, recent hypovolemia  from this.  Also she may have an arrhythmia as she does have signs of  conduction abnormality on EKG.  She is on methadone and Lyrica which may  also account for the weakness given her age.  She will be admitted for  hydration and will hold methadone for now as well as the Torsemide.  Will place on telemetry to rule out pauses.  Will check a CK as she is  on high dose Pravastatin.  Will elevate her legs with TEDD hose as well.  Will check orthostatics.  Will also check a chest x-ray to rule out  possible occult pneumonia as a cause of this.  Further workup will be  per [facility] inpatient medicine.     Darryl D. Prime, MD  Electronically Signed    DDP/MEDQ  D:  11/06/2008  T:  11/07/2008  Job:  [id number]

## 2011-01-22 NOTE — Discharge Summary (Signed)
[name], [name]              ACCOUNT NO.:  [id number]   MEDICAL RECORD NO.:  [id number]          PATIENT TYPE:  INP   LOCATION:  1415                         FACILITY:  [facility]   PHYSICIAN:  Rosalyn Gess. Norins, MD  DATE OF BIRTH:  Nov 02, 1915   DATE OF ADMISSION:  11/06/2008  DATE OF DISCHARGE:  11/08/2008                               DISCHARGE SUMMARY   ADMITTING DIAGNOSIS:  Fall.   DISCHARGE DIAGNOSIS:  Fall with no identified etiology.   PROCEDURES:  1. CT of the head without contrast performed day of admission which      showed no acute findings and stable atrophy.  2. Two-view of the chest day of admission which showed probable      bibasilar atelectasis with no acute airspace disease.   HISTORY OF PRESENT ILLNESS:  The patient is a 77 year old woman with a  history of degenerative joint disease, breast cancer, MI with  nonobstructive coronary artery disease felt to be due to severe coronary  spasm, history of PAF, history of asthma.  On the day of admission she  was walking into the kitchen and had a sudden fall.  She denies having  tripped.  She denies dizziness.  She denies palpitations.  She had no  loss of consciousness.  The patient's daughter was present.  She reports  that the patient has been experiencing weakness all week.  The patient  had no chest pain, no worsening shortness of breath, no change in her  medications for many months.  In the emergency department, it was felt  she was significantly hypovolemic given an elevated BUN and creatinine.  She was subsequently admitted for IV fluids and telemetry monitoring.  Of note, the patient had a small laceration to the occiput and had  laceration stapled in the emergency room.   Please see the H and P for past medical history, family history, social  history, and admission physical exam.   HOSPITAL COURSE:  Fall.  The patient was admitted to a telemetry unit.  She had no significant abnormalities on telemetry  with report of mild  sinus bradycardia with first-degree AV block.  She had no other  significant arrhythmias, no pauses or other cardiac causes for fall or  loss of consciousness.   The patient was gently hydrated.  On this regimen she had an improvement  in BUN and creatinine with the BUN going from 39 to 19, creatinine going  from 1.84 to 0.92.   The patient did have a physical therapy evaluation during this hospital  stay.  It was felt she would benefit from home health PT to improve her  balance and stability.   With the patient being adequately rehydrated with normalization of renal  function and with no abnormalities on telemetry with her feeling well,  doing well, and having a good appetite, she is thought to be stable and  ready for discharge home.   DISCHARGE EXAMINATION:  Temperature was 97.9, blood pressure 134/57,  heart rate 65, respirations 20.  Telemetry with sinus bradycardia mild  with first-degree AV block.  I's and O's were negative  at 500 mL over  the last 24 hours.  Weight actually dropped from 65.3 to 64 kg during  hospital stay.  GENERAL APPEARANCE:  This is a very pleasant woman looking younger than  her stated chronologic age sitting in a chair eating her breakfast in no  acute distress.  CHEST:  Patient is moving air well with no rales, wheezes, no rhonchi  were noted.  There is no increased work of breathing.  No use of  accessory musculature.  CARDIOVASCULAR:  2+ radial pulse.  Her precordium was quiet, her heart  rate was regular to my exam.  ABDOMEN:  Soft.  EXTREMITIES:  Without edema.  No further examination conducted.   FINAL LABORATORIES:  Basic metabolic panel day of discharge with sodium  of 141, potassium 4.1, chloride 109, CO2 of 29, BUN of 19, creatinine  0.92, glucose was 85.  Estimated GFR was 57.  Liver function panel the  day of discharge was normal except for a low albumin at 2.7, low total  protein at 4.6.  TSH was checked and was  normal at 1.620.  Cardiac  enzymes were negative x1 plus point of care enzymes in the emergency  department.  Urinalysis was negative.   DISCHARGE MEDICATIONS:  The patient will resume all home medications as  listed on the medication reconciliation sheet including:   1. Pravastatin 80 mg daily.  2. Lyrica 25 mg in a.m., 50 mg at bedtime.  3. Methadone same dose as at home with 5 mg in the morning, 10 mg at      bedtime.  4. Furosemide 20 mg daily.  5. Potassium 20 mEq daily.  6. Trazodone 150 mg q.h.s.  7. Aspirin 81 mg daily.  8. Fluoxetine unspecified dose.  9. Fortical 50 mcg 2 puffs daily.  10.Serevent 50 mcg 2 puffs daily.  11.Hydrocodone with acetaminophen 10/325 q.4 p.r.n.  12.Lidoderm patch 5% applied to areas of pain p.r.n.   DISPOSITION:  1. The patient will be discharged home.  Will request home health PT      for evaluation and treatment per physical therapy recommendation.      Will ask for a home health nursing assessment, and follow up twice      a week for 2 weeks.  2. The patient will see Dr. [name] in follow-up in 7-10 days.   PATIENT'S CONDITION AT TIME OF DISCHARGE DICTATION:  Stable and  improved.      Rosalyn Gess Norins, MD  Electronically Signed     MEN/MEDQ  D:  11/08/2008  T:  11/08/2008  Job:  [id number]   cc:   [name], M.D.  [place] [place], [place]

## 2011-01-22 NOTE — Assessment & Plan Note (Signed)
[facility]                                 ON-CALL NOTE   NAME:[name], [name]                       MRN:          [id number]  DATE:09/19/2007                            DOB:          [birth year]    TIME OF CALL:  10:51am   TELEPHONE NUMBER:  [phone]   CALLER:  [name].   OBJECTIVE:  The patient can barely walk because of arthritis. She is on  pain medicines. She is a patient of the pain clinic which helps a little  bit. The patient's daughter was instructed to take the patient to urgent  care, emergency room, or follow up with her primary care physician on  Monday, as we do not adjust pain medicines prescribed by the pain clinic  over the phone or on weekends without the patient being seen. They do  not want to come into the Saturday clinic.   PRIMARY CARE PHYSICIAN:  Dr. [name].   HOME OFFICE:  [facility].     Arta Silence, MD  Electronically Signed    RNS/MedQ  DD: 09/19/2007  DT: 09/20/2007  Job #: [id number]

## 2011-01-25 NOTE — H&P (Signed)
[name], [name]              ACCOUNT NO.:  [id number]   MEDICAL RECORD NO.:  [id number]          PATIENT TYPE:  INP   LOCATION:  1826                         FACILITY:  [facility]   PHYSICIAN:  Sean A. Everardo All, M.D. [facility] OF BIRTH:  [birth year]   DATE OF ADMISSION:  01/31/2005  DATE OF DISCHARGE:                                HISTORY & PHYSICAL   REASON FOR ADMISSION:  Chest pain.   HISTORY OF PRESENT ILLNESS:  The patient is an 77 year old woman who  yesterday in the context of eating had the onset of left upper chest pain.  She had some associated fatigue. It resolved after a few minutes and has not  recurred.   PAST MEDICAL HISTORY:  1.  Dyslipidemia.  2.  Left shoulder surgery one year ago.  3.  Heart catheterization in 2004, for which heart catheterization      apparently showed nonobstructing disease.  4.  Depression.  5.  Apparent history of CHF, but uncertain.  6.  It is not known why she is on Accolate.   MEDICATIONS (DOSAGES UNKNOWN):  Prozac, Nitrostat, Pravachol, Accolate,  Demadex, potassium chloride, Desyrel, and Vicodin.   FAMILY HISTORY:  No one else at home is ill.   SOCIAL HISTORY:  She is widowed and she lives with her daughter, Johnnette Litter, who is not at the bedside, so history is obtained from the patient.   REVIEW OF SYSTEMS:  Denies the following: Nausea, vomiting, loss of  consciousness, shortness of breath, abdominal pain, rectal bleeding,  hematuria, dysuria, seizure, excessive diaphoresis, and palpitations.   PHYSICAL EXAMINATION:  VITAL SIGNS: Blood pressure is 130/50, heart rate is  71, respiratory rate 34, temperature 99.0.  GENERAL: An elderly woman in no distress.  SKIN: Not diaphoretic.  HEENT: Head is atraumatic. Sclerae nonicteric. Pharynx with no erythema.  Mucous membranes are dry.  NECK: Supple.  CHEST: Nontender and clear to auscultation.  CARDIOVASCULAR: No JVD. No edema. Regular rate and rhythm with no murmur.  Pedal  pulses are intact.  ABDOMEN: Soft, nontender. No hepatosplenomegaly and no masses.  BREASTS/GYNECOLOGIC/RECTAL EXAMS: Not done at this time due to patient's  condition.  EXTREMITIES: Osteoarthritic changes are present.  NEUROLOGIC: She is alert and cooperative. Cranial nerves appear to be  intact.   LABORATORY DATA:  CBC remarkable for hemoglobin 10.8, platelet count  185,000. CPK and troponin are both undetectable. Chemistries remarkable only  for potassium 3.6. Urinalysis negative. Chest x-ray shows cardiomegaly with  fullness of the vasculature.   IMPRESSION:  1.  Chest pain of uncertain etiology.  2.  Low-grade fever of uncertain etiology.  3.  Chest x-ray is apparently consistent with a history of inactive      congestive heart failure.  4.  Anemia.  5.  Other chronic medical problems as noted.   PLAN:  1.  Blood cultures.  2.  Antibiotics.  3.  CPKs.  4.  Symptomatic therapy.  5.  Workup anemia.  6.  I discussed code status with the patient and she requests DNR status.      SAE/MEDQ  D:  01/31/2005  T:  01/31/2005  Job:  [id number]   cc:   Titus Dubin. [name], M.D. [facility]

## 2011-01-25 NOTE — H&P (Signed)
[name], [name]              ACCOUNT NO.:  [id number]   MEDICAL RECORD NO.:  [id number]          PATIENT TYPE:  INP   LOCATION:  0101                         [facility]AC[facility]   PHYSICIAN:  Garrison Columbus. Haithcock, MDDATE OF BIRTH:  Dec 04, 1915   DATE OF ADMISSION:  07/30/2006  DATE OF DISCHARGE:                                HISTORY & PHYSICAL   CHIEF COMPLAINT:  Shortness of breath and congestion.   [name] is an 77 year old woman with a history as listed who was in her  regular state of health until 5 days ago when she noted coughing and  congestion as well as low-grade fevers.  The cough was productive of yellow  sputum.   PAST MEDICAL HISTORY:  1. Severe osteoarthritis.  2. Chronic pain, cervical and lumbar spine.  3. History of remote breast cancer status post bilateral mastectomies.  4. 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  5. History of asthma and COPD, stable for 2 or 3 years.  6. History of vasospasm and myocardial infarction in 2004, but      nonobstructive coronary artery disease by cath.  7. History of bilateral thyroid nodules appear to be benign by fine-needle      aspirate.  8. History of paroxysmal atrial fibrillation, not a Coumadin candidate.   PAST SURGICAL HISTORY:  1. Cholecystectomy.  2. Appendectomy.  3. Hysterectomy.  4. Partial gastrectomy for peptic ulcer disease.  5. Total knee arthroplasty.  6. Left shoulder surgery.   ALLERGIES:  MORPHINE, PENICILLIN, AND MOXIFLOXACIN.   MEDICATIONS:  1. Pravachol 80 mg daily.  2. Multivitamin daily.  3. Methadone 5 mg daily.  4. Furosemide 20 mg daily.  5. Trazodone 100 mg daily.  6. Accolate 20 mg b.i.d.  7. Aspirin 81 mg daily.   REVIEW OF SYSTEMS:  Otherwise negative.   SOCIAL HISTORY:  Lives unassisted.   PHYSICAL EXAMINATION:  VITAL SIGNS:  Temperature 98.3, blood pressure  105/55, pulse 88, respiratory rate 20.  GENERAL:  She is a pleasant woman in no apparent distress.  NECK:   Supple.  LUNGS:  Clear.  CHEST WALL:  She has chest wall tenderness on palpation of the lateral  aspect.  Her symptoms of chest pain are worse with deep breathing.  ABDOMEN:  Soft and nontender.  NEUROLOGIC EXAM:  Nonfocal.   DATA:  White count is 7.4, hematocrit of 31%, platelets of 215.   Chest x-ray shows persistent opacity of the left costophrenic angle.  This  was shown on a prior CT of the chest.  This could represent recurrent  infiltrate or possibly localized malignancy.  Followup chest x-ray is  recommended.  Mild blunting of the posterior costophrenic.   ASSESSMENT AND PLAN:  [name] is an 77 year old woman with presumed  community-acquired pneumonia, while she has no chest pain or fever.  We will  admit the patient for IV antibiotics and follow.      Daniel B. Haithcock, MD  Electronically Signed     DBH/MEDQ  D:  07/31/2006  T:  07/31/2006  Job:  [id number]

## 2011-01-25 NOTE — Discharge Summary (Signed)
[name], [name]              ACCOUNT NO.:  [id number]   MEDICAL RECORD NO.:  [id number]          PATIENT TYPE:  INP   LOCATION:  6735                         FACILITY:  [facility]   PHYSICIAN:  Rene Paci, M.D. LHCDATE OF BIRTH:  10/24/1915   DATE OF ADMISSION:  01/31/2005  DATE OF DISCHARGE:  02/05/2005                                 DISCHARGE SUMMARY   DISCHARGE DIAGNOSES:  1. Pneumonia with acute asthma exacerbation.  2. Hypotension.     HISTORY OF PRESENT ILLNESS:  The patient is an 77 year old female who  reported a history of left upper chest pain on admission which resolved  after a few minutes.  The patient was admitted for further evaluation.   PAST MEDICAL HISTORY:  1. Dyslipidemia.  2. Left shoulder surgery one year ago.  3. Cardiac catheterization in 2004 which apparently showed nonobstructing      disease.  4. Depression.  5. History of CHF.     COURSE OF HOSPITALIZATION:  The patient was admitted.  Chest x-ray was  performed which showed cardiomegaly with vascular congestion and mild left  lower lobe interstitial edema.  In addition, a D-dimer was performed which  was slightly elevated at 0.63.  As a result, the patient underwent CT  angiography which was negative for pulmonary embolus, however, did not an  enlarging left thyroid nodule.  The patient was started on Avelox IV.  In  addition, the patient was also placed back on outpatient medications which  included Accolate, Serevent and Singulair.  The patient improved clinically  during hospitalization.   Hypotension:  The patient developed an episode of hypotension during the  hospitalization.  As a result, Lyrica was decreased to 25 mg daily and  Singulair was on hold.  In addition, Desyrel was held.  The patient's blood  pressure improved.  The blood pressure at the time of discharge is 120/58.   The patient was maintained on statin for dyslipidemia and the patient  underwent cardiac enzymes  which were negative for MI.   MEDICATIONS ON DISCHARGE:  1. Dermedex 20 mg p.o. daily.  2. Potassium chloride 20 mEq p.o. daily.  3. Aspirin 81 mg p.o. daily.  4. Protonix 40 mg p.o. daily.  5. Desyrel 100 mg p.o. h.s.  6. Prozac 20 mg p.o. daily.  7. Serevent one puff b.i.d.  8. Calcitonin nasal spray one nostril daily, alternating nostrils.  9. Lyrica 50 mg p.o. q.8h.  10.Pravachol 80 mg p.o. h.s.  11.Singulair 10 mg h.s.  12.Resume outpatient dosing of methadone.     LABORATORIES AT DISCHARGE:  Occult blood negative.  Hemoglobin 10.5,  hematocrit 31.1.   FOLLOWUP:  The patient is to follow up with Dr. [name] in one week.   At the time of this dictation, the patient's 2-D echocardiogram has been  performed to rule out aortic stenosis secondary to episode of hypotension,  however, the results of the 2-D echocardiogram have not yet been read.      MSO/MEDQ  D:  02/05/2005  T:  02/05/2005  Job:  [id number]   cc:   Titus Dubin. [name],  M.D. [facility]

## 2011-01-25 NOTE — H&P (Signed)
NAMEJAYSHA, [name]              ACCOUNT NO.:  [id number]   MEDICAL RECORD NO.:  [id number]          PATIENT TYPE:  INP   LOCATION:  0102                         [facility]   PHYSICIAN:  Sean A. Everardo All, M.D. [facility] OF BIRTH:  [birth year]   DATE OF ADMISSION:  02/24/2006  DATE OF DISCHARGE:                                HISTORY & PHYSICAL   CHIEF COMPLAINT:  Right leg worsening infection.   HISTORY OF PRESENT ILLNESS:  [name] is an 77 year old Caucasian woman  with a recent dog scratch 5 days ago, who presents with worsening cellulitis  of the right shin.  The patient was scratched by a puppy 6 days ago and  developed localized erythema.  Two days later, she presented to her PCP, who  started her on doxycycline; however, her right leg swelling worsened and she  developed a necrotic center.  She denies any fevers or chills.  She also  notes some mild drainage from the site and due to worsening swelling, pain  and increase in the size of the cellulitis, the patient presented to the  emergency room tonight.  She was started on Avelox due to her penicillin  allergy, but reacted with a significant burning and rash.  Avelox  administration was subsequently aborted.   PAST MEDICAL HISTORY:  Past medical history is notable for:  1.  Severe osteoarthritis.  2.  Bilateral total knee arthroplasties and left shoulder arthroplasty.  3.  Chronic pain, followed by pain management for cervical and lumbar spine      pain.  4.  History of remote breast cancer, status post bilateral mastectomy.  5.  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  6.  History of asthma and COPD, stable for 2-3 years.  7.  History of vasospasm and myocardial infarction in 2004 with      nonobstructive coronary artery disease by cath.  8.  History of bilateral thyroid nodules and appeared to be benign by fine-      needle aspirate.  9.  Anemia is normocytic.  10. History of paroxysmal atrial fibrillation, not  a Coumadin candidate.  11. Dyslipidemia.  12. History of remote PMR.  13. Left eye detached retina, now legally blind in that eye.   PAST SURGICAL HISTORY:  1.  Cholecystectomy.  2.  Appendectomy.  3.  Hysterectomy.  4.  Partial gastrectomy for peptic ulcer disease in the distant past.  5.  Bilateral mastectomies.  6.  Bilateral total knee arthroplasties.  7.  Left shoulder arthroplasty.   ALLERGIES:  MORPHINE, PENICILLIN and now MOXIFLOXACIN.   MEDICATIONS:  1.  Pravachol 80 mg once a day.  2.  MVI  one tablet a day.  3.  Prozac 20 mg a day.  4.  Lyrica 25 mg twice a day.  5.  Norco 10/325 mg every 4 hours.  6.  Methadone 5 mg every 4 hours.  7.  Torsemide 20 mg a day.  8.  K-Dur 20 mEq a day.  9.  Trazodone 100 mg one and a half tablets twice a day.  10. Accolade 20 mg twice  a day.  11. Aspirin 81 mg a day.  12. Maxair 200 mcg as needed.  13. Miacalcin 200 IU nasal one spray once a day.  14. Serevent 50 mcg every 12 hours.  15. One-A-Day vitamin once a day.  16. Calcium 600 mg once a day.  17. MiraLax p.r.n.   SOCIAL HISTORY:  The patient denies any history of tobacco, alcohol or drug  use.  She lives with her daughter in [place].   FAMILY HISTORY:  Family history is notable for no significant coronary  artery disease, diabetes, hypertension or hyperlipidemia.   REVIEW OF SYSTEMS:  Review of systems is notable for severe back pain,  requiring methadone.  Otherwise, 12 review of systems are negative.   PHYSICAL EXAMINATION:  VITAL SIGNS:  Temperature is 98.4, blood pressure  111/59, pulse 63, respiratory rate of 18.  GENERAL:  The patient is awake, alert, oriented x3, in no acute distress.  HEENT:  Normocephalic, atraumatic.  Pupils are equal, round and reactive to  light.  Extraocular movements are intact.  NECK:  No JVD.  CARDIOVASCULAR:  Regular rhythm, normal rate, no murmurs, rubs, or gallops.  LUNGS:  Clear to auscultation bilaterally.  ABDOMEN:  Positive  bowel sounds.  Soft, nontender and non-distended.  EXTREMITIES:  No cyanosis or clubbing.  There is bilateral 2+ chronic lower  extremity edema with right shin demonstrating a 2 x 1-cm erythematous lesion  with a necrotic center and diffuse surrounding tissues.  NEUROLOGIC:  Cranial nerves II-XII grossly intact.  No focal musculoskeletal  or sensory deficits.   LABORATORY DATA:  Labs show a white count of 3.9, hematocrit of 30.4,  platelets of 177,000; BUN of 22, creatinine of 1.5, glucose of 101.   ASSESSMENT AND PLAN:  This is an 77 year old Caucasian woman with a dog  scratch cellulitis, not responding to doxycycline.   1.  We will start intravenous clindamycin and azithromycin due to the      patient's allergy to quinolone as well as penicillin.  2.  We will      obtain x-ray of the right leg to rule out osteomyelitis.  2.  We will consult wound care in the morning.  3.  Consider CT of the leg to evaluate soft tissue anaerobic cavity if x-ray      is unremarkable.  4.  Continue rest of home medications.  5.  Deep venous thrombosis prophylaxis, subcutaneous heparin 5000 units      b.i.d.      Reginia Forts, MD  Electronically Signed     ______________________________  Cleophas Dunker Everardo All, M.D. [facility]    RA/MEDQ  D:  02/25/2006  T:  02/25/2006  Job:  [id number]

## 2011-01-25 NOTE — Op Note (Signed)
NAMELAURIANNE, FLORESCA                        ACCOUNT NO.:  [id number]   MEDICAL RECORD NO.:  [id number]                   PATIENT TYPE:  OIB   LOCATION:  NA                                   FACILITY:  [facility]   PHYSICIAN:  [name] L. Rendall III, M.D.           DATE OF BIRTH:  09/08/1916   DATE OF PROCEDURE:  06/20/2003  DATE OF DISCHARGE:                                 OPERATIVE REPORT   PREOPERATIVE DIAGNOSIS:  Massive tear of rotator cuff, left shoulder.   POSTOPERATIVE DIAGNOSIS:  Massive tear of rotator cuff, left shoulder with  osteoarthritis.   OPERATION PERFORMED:  Arthroscopic subacromial decompression with  acromioplasty, debridement of acromioclavicular joint, plus glenoid labral  debridement and chondroplasty plus debridement, large rotator cuff tear.   SURGEON:  [name] L. Rendall, M.D.   ASSISTANT:  Madilyn Fireman, P.A.-C.   ANESTHESIA:  General.   PATHOLOGY:  The patient is an 77 year old with chronic rotator cuff tear  that is irreparable.  She has had increasing pain despite cortisone  injections.  At time of surgery, a massive cuff tear was found.  Glenohumeral arthritis with chondromalacia grade 2 and 3 on both the glenoid  and humeral head respectively.  In addition, there was fraying of labrum and  a SLAP 2 lesion under the biceps tendon which was debrided.  There is  significant fraying of the rotator cuff torn margin and chondromalacia of  the undersurface of the acromion.   DESCRIPTION OF PROCEDURE:  Under general anesthesia, the patient was placed  on the right lateral decubitus position on the bean bag with the left arm  suspended on the fishing pole shoulder holder with a 10 pound weight.  It  was abducted 30 degrees and forward flexed 30 degrees, anatomic landmarks  were marked out with marking pen and the patient is slender enough that  these are easily discernible.  Posterior injection of Xylocaine 1% with  epinephrine was done to assist in controlling  bleeding and then the scope  was placed through a posterior portal and by Wissinger rod technique, an  anterior portal was also obtained.  This allows glenohumeral arthroscopic  diagnostic check up and with debridement was found necessary for the labrum,  it was performed along with chondroplasty of the glenoid and humeral head.  Minor debridement along the edge of the biceps was done with the  glenohumeral debrided.  Attention was then turned to the subacromial bursa.  There are fronds of angry synovial tissue and grade 2 chondromalacia of the  undersurface of the acromion after superficial debridement and stirring up a  little bleeding, an [employer] wand was used to cauterize these areas.  Then  a periosteal resector portion of the wand was used on the undersurface of  the acromion and with take-down of the CA ligament.  The wand was used over  to the acromioclavicular joint and significant spurring was seen there.  Following  debridement of the periosteum and CA ligament at the margins of  the acromion, the 6 mm bur was used to take out 4 mm of the anterolateral  acromion over to the [facility] joint.  The [facility] joint itself was smoothed by a  planing technique and once this was completed, a rasp was  inserted to further smooth any rough areas.  Once this was completed, the  shoulder was irrigated with saline.  Traction was taken down at 40 minutes  of traction time.  The punctures were closed with 4-0 nylon.  The shoulder  was then injected with Marcaine 0.5% with epinephrine.                                                [name] L. Dorothyann Gibbs, M.D.    Renato Gails  D:  06/20/2003  T:  06/20/2003  Job:  [id number]

## 2011-01-25 NOTE — Discharge Summary (Signed)
[place]. [facility]  Patient:    [name], [name] Visit Number: [id number] MRN: [id number]          Service Type: [facility] Location: [id number] Attending Physician:  Faith Rogue T Dictated by:   Mcarthur Rossetti. Angiulli, P.A. Admit Date:  07/23/2001 Disc. Date: 07/29/01   CC:         Titus Dubin. [name], M.D. [facility]  [name] L. Dorothyann Gibbs, M.D., orthopedic services   Discharge Summary  DISCHARGE DIAGNOSES: 1. Right total knee arthroplasty. 2. Anemia. 3. Benign tremor. 4. Asthma. 5. Depression. 6. History of left total knee arthroplasty in 1997.  HISTORY OF PRESENT ILLNESS:  This is an 77 year old white female with history of left total knee arthroplasty in 1997 who now presents on July 20, 2001, with increased right knee pain, secondary to end-stage degenerative joint disease and no relief with conservative care. She underwent a right total knee arthroplasty July 20, 2001, per [name] L. Dorothyann Gibbs, M.D.  She was placed on Arixtra for deep venous thrombosis prophylaxis and weightbearing as tolerated.  The patient with problem tremor with tonic clonic activity. Internal medicine follow-up with Titus Dubin. [name], M.D., advised to discontinue her E-Vista and place on Zanaflex x 4 weeks total as needed. There was no chest pain or shortness of breath. This was not felt to be Parkinsons disease but questionably drug related. Moderate assist for ambulation and moderate assist transfers.  Latest hemoglobin 8.9, hematocrit 26. Chemistries unremarkable.  The patient was admitted for a comprehensive rehab program.  PAST MEDICAL HISTORY:  See discharge diagnoses.  PAST SURGICAL HISTORY:  Gastric surgery, hysterectomy, cholecystectomy, bilateral mastectomy and right ankle and wrist surgery.  MEDICATIONS PRIOR TO ADMISSION:  Vicodin, Pulmicort, Prozac, Trazodone, Accolate, potassium chloride, Demodex, Maxair, Serevent inhaler, E-Vista, methadone  5 mg every four hours not to exceed five times a day, Neurontin 200 mg every eight hours, and prednisone 5 mg daily.  ALLERGIES:  PENICILLIN and MORPHINE.  PRIMARY M.D.:  Titus Dubin. [name], M.D.  SOCIAL HISTORY:  No tobacco, occasional alcohol.  The patient [place]  She was independent with a cane prior to admission.  She lives in a two level home.  She stays on the first floor. There are three to four steps at entry to the home.  Family to assist on discharge.  HOSPITAL COURSE:  The patient did well while in rehabilitation services with therapies initiated on a b.i.d. basis.  The following issues were followed during the patients rehab course.  Pertaining to Ms. McCullars right total knee arthroplasty, remained stable. Surgical site healing nicely.  No signs of infection.  She was weightbearing as tolerated with a walker, requiring supervision for her ambulation.  Range of motion to the knee of 0 to 78 degrees.  Arrangements were made for home health therapies ongoing. Postoperative anemia stable with latest hemoglobin 8.2, hematocrit 23.8. There were no bleeding episodes.  This follow-up hemoglobin on July 28, 2001, after transfusion of 8.2, showed an improvement in hemoglobin to 11.  She will continue on her iron supplement., She was seen by internal medicine, Dr. [name], for her benign tremor of which her E-Vista had been discontinued and now placed on Zanaflex as needed, for a total of four weeks and then follow with Dr. [name].  He asthma remained controlled. There was no shortness of breath.  She will continue on her inhalers as well as her prednisone therapy. She had  a documented history of depression. She was cooperative with staff, resting well at night, good appetite. She would be maintained on her Prozac daily. She had no bowel or bladder disturbances throughout her rehab course other than some mild constipation that was  relieved with laxative assistance.  Overall for her functional mobility, she was supervision for ambulation, modified independent with bed mobility, supervision for activities of daily living, of dressing, grooming and homemaking. Family teaching was completed. Plan was for home health therapies.  Latest lab showed a sodium 141, potassium 3.5, BUN 12, creatinine 0.9. Hemoglobin 11, hematocrit 32.8.  It was noted that the patient was on Arixtra for deep venous thrombosis prophylaxis throughout her rehab course and this was discontinued at the time of discharge.  DISCHARGE MEDICATIONS:  1. Neurontin 100 mg two capsules every eight hours.  2. Trinsicon twice daily.  3. Prozac 20 mg daily.  4. Desyrel 100 mg two capsules at bedtime.  5. Accolate 20 mg daily.  6. K-Dur 10 mEq daily.  7. Demodex 20 mg daily.  8. Serevent inhaler two puffs twice daily.  9. Prednisone 5 mg daily. 10. Methadone 5 mg every four hours not to exceed five times a day. 11. Pulmicort inhaler two puffs twice daily. 12. Zanaflex 2 mg every six hours as needed. 13. Vicodin as needed for pain. 14. Celebrex 100 mg daily.  ACTIVITY:  Weightbearing as tolerated with walker.  DIET:  Regular.  WOUND CARE:  Cleanse incision daily with warm soap and water.  SPECIAL INSTRUCTIONS:  Home health physical and occupational therapy.  FOLLOW-UP:  She should follow up with Dr. [name] for medical management, Dr. Priscille Kluver with orthopedic services. Dictated by:   Mcarthur Rossetti. Angiulli, P.A.  Attending Physician:  Faith Rogue T DD:  07/28/01 TD:  07/28/01 Job: [id number]

## 2011-01-25 NOTE — H&P (Signed)
[name], [name] NO.:  [id number]   MEDICAL RECORD NO.:  [id number]          PATIENT TYPE:  INP   LOCATION:  2628                         FACILITY:  [facility]   PHYSICIAN:  Titus Dubin. [name], M.D. [facility] OF BIRTH:  1915-11-11   DATE OF ADMISSION:  11/18/2005  DATE OF DISCHARGE:                                HISTORY & PHYSICAL   [name] is an 77 year old white female admitted with atypical chest  pain.   She was seen acutely, November 18, 2005, for pain in the left thorax,  in the  area of the scapula.  Her symptoms have actually been present to some extent  for several months.  She has been having an aching pain in the left lower  quadrant, mostly at night at rest while sitting.  It has been progressive  over the last several days culminating in pain which radiated to the chest  as noted above.  The sharp pain in her chest lasted 1/2 hours.  She had some  burping and bitter taste in her mouth.  She denies any exertional chest  pain.  She has had no associated nausea or diaphoresis.  A better  clarification of her pain was not possible, despite pressing her as to the  modifying factors associated signs and symptoms.  Hospitalization was felt  to be necessary because of atypical chest pain as she has multi-system  disease and had a silent MI in 2004.   PAST MEDICAL HISTORY:  Long and complicated.  1.  She has had a partial gastrectomy for an ulcer.  2.  She has had cholecystectomy.  3.  Hysterectomy.  4.  She was hospitalized, in 2004, with syncope.  5.  She has shoulder surgery.  6.  In 2005, she was hospitalized with right middle lobe pneumonia.  7.  She has had a needle biopsy of her thyroid for a nodule.  8.  Colonoscopy has revealed diverticulosis and polyps.  9.  She was hospitalized, in 2006, with left upper lobe pneumonia.  The      thyroid nodule was found to be enlarging at that time.  10. She has underlying COPD with an asthmatic component.  11. She has degenerative joint disease.  12. She has had polymyalgia rheumatica.  13. She has been treated as an outpatient for depression and sees Dr.      Donell Beers.   Her mother had a goiter and arthritis and ischemic bowel disease.  Lucila Maine  has melanoma.   She has never smoked.   [facility] and is on:  1.  Methadone 5 mg every 4 hours.  2.  Norco 10/325 every 4 hours.  3.  Lyrica 25 mg twice a day.  4.  She is also on Prozac 20 mg daily.  5.  Pravachol 80 mg daily.  6.  Torsemide 20 mg daily.  7.  K-Dur 20 mg daily.  8.  Trazodone 100 mg two daily.  9.  Accolate 20 mg twice a day.  10. Aspirin 81 mg daily.  11. Maxair 200 mcg as  needed.  12. Miacalcin nasal spray alternating nostrils daily.  13. Serevent inhalations every 12 hours.  14. Calcium and vitamin D supplement.   She had previously been on the trazodone 200 mg at bedtime but the dose was  decreased because of prior bradycardia attributed to the medicine.  This has  been discussed with the patient.  In June 2006, she requested an increase in  the dose because of poor sleep hygiene.   REVIEW OF SYSTEMS:  Reveals constipation for one week.  She has had edema of  the left upper extremity and swelling particularly at the left wrist.   She is intolerant or allergic to.  1.  PENICILLIN.  2.  MORPHINE.  3.  TEQUIN.  4.  OXYCONTIN.  5.  AMINOPHYLLINE.   As stated, trazodone was thought to cause sinus bradycardia but the patient  does not feel this is an issue.   She does have trouble with insight.  When I discuss the possibility of  thyroid surgery because of the nodule, I stated that she was extremely high  risk because of her multi-system problems including coronary artery disease.  She and her daughter were shocked that I suggested that there was increased  risk with surgery.  Their response was well she did fine with shoulder  surgery.   PHYSICAL EXAMINATION:  GENERAL:  She  appears somewhat younger than her  stated age.  She appears somewhat flustered but no acute distress.  VITAL SIGNS:  Weight is 144 and 1/2 which is down 5 and 1/2 pounds.  Pulse  is 80 and irregular.  Respiratory rate is 22.  Blood pressure 110/44.  HEENT:  She has arteriolar  narrowing.  Otolaryngologic exam is essentially  unremarkable except for fair dental hygiene.  Tympanic membranes are dull.  HEART:  No rub is noted on auscultating the chest.  Heart rhythm is  irregular with a flow murmur.  She is tender over the chest without definite  costochondritis.  Specifically, compression or palpation did not elicit the  type of pain she is experiencing.  ABDOMEN:  She is also tender in the left upper quadrant and left lower  quadrant.  LYMPH:  She had no lymphadenopathy about the head, neck, or axilla.  NEUROLOGIC:  She moved somewhat slowly but had no obvious neuromuscular  deficit.  As stated, she was somewhat anxious which may contribute to her  being a poor historian.   EKG revealed nonspecific ST-T wave changes with possible progression of the  depression in V2-V3.   1.  She will be admitted to telemetry for atypical chest pain with serial      EKGs and cardiac enzymes.  2.  D-dimer will be performed.  3.  Stool will be checked for occult blood and she will be placed on proton      pump inhibitor twice a day.  4.  Depending on the results from the studies,  cardiology consultation may      be pursued.      Titus Dubin. [name], M.D. [facility]  Electronically Signed     [facility]/MEDQ  D:  11/19/2005  T:  11/19/2005  Job:  [id number]   cc:   Loraine Leriche L. Vear Clock, M.D.  Fax: [phone]   Pricilla Riffle, M.D.  [address]  [address]  [place]  [place] [address]   Archer Asa, M.D.

## 2011-01-25 NOTE — H&P (Signed)
[facility]  Patient:    [name], [name]                     MRN: [id number] Adm. Date:  [id number] Attending:  Thyra Breed CC:         Titus Dubin. [name], M.D. [facility]   History and Physical  FOLLOW-UP:  Graycen stopped by today after seeing Dr. [name] because she is having problems with constipation and lack of appetite and is going up on the OxyContin to four tablets per day.  She states her pain is fairly well-controlled.  I discussed going ahead and switching her over to the Duragesic patch at 50 mcg and she is interested in doing this.  She knows that she will need to take some Senokot or something with a laxative in it in order to keep her bowels from becoming a major problem.  I plan to see her back at her next scheduled appointment. DD:  06/09/00 TD:  06/09/00 Job: [id number]

## 2011-01-25 NOTE — Consult Note (Signed)
[place]. [facility]  Patient:    [name], [name] Visit Number: [id number] MRN: [id number]          Service Type: SUR Location: [id number] Attending Physician:  Carolan Shiver Ii Dictated by:   Titus Dubin. [name], M.D. [facility] Admit Date:  07/20/2001   CC:         [name] L. Dorothyann Gibbs, M.D.  Gypsy Balsam, M.D.  Thyra Breed, M.D.   Consultation Report  HISTORY:  The patient is an 77 year old white female who is status post total knee replacement as of November 11.  Her postoperative rehabilitation has been somewhat complicated by a coarse tremor, for which consultation was requested.  She has actually had a tremor since September.  I saw her for this problem on October 2.  She described trembling-type activity in the right arm present for one week, associated with dizziness.  She questioned whether this might be related to medication.  She was on Prozac, trazodone and methadone as well as multiple other drugs including prednisone, Protonix, Serevent, Evista, Demodex and Accolate.  There is a family history of a tremor in her son, but it was self-limted.  On exam at that time, there was no cogwheeling noted on extremity exam.  She had an intermittent coarse tremor of the right upper extremity which involved the forearm.  At that time, I asked her to discuss her chronic pain medications and psychotropic drugs with Dr. Donell Beers and Dr. Thyra Breed, who follows her in the pain clinic.  In the postoperative state, she now describes what she calls a tremor, but is really asterixis-type activity of all extremities.  She states that it is worse with intentional acts such as trying to use the walker or eating.  There is no definite intention tremor.  PHYSICAL EXAMINATION:  GENERAL:  She is alert and oriented.  She is in no acute distress despite her history of asthmatic bronchitis.  CHEST:  Clear.  HEART:  She has a grade 1 systolic  murmur.  NECK:  Thyroid is small.  NEUROLOGIC:  Deep tendon reflexes are 1+.  She could not be ambulated, as the leg is in the mechanical exerciser.  LABORATORY DATA:  A review of the labs indicates normal BUN and creatinine of 9 and 0.9 respectively.  IMPRESSION:  The asterixis may be related to the change in the frequency of administration of her medications postoperatively.  There is certainly no evidence to suggest parkinsonian tremor or an intention tremor.  RECOMMENDATIONS:  In this immediate postoperative period, it is not the time to begin weaning her psychotropic drugs and pain medications.  I would recommend that she stop the Evista, as she is bed ridden, and there could be the increased risk of deep venous thrombosis and pulmonary emboli.  I would recommend a trial of Zanaflex 4 mg, 1/2 to 1 q.4-6h. as needed for the asterixis-type activity.  Once she is stable and discharged, she could confer with Dr. Donell Beers and Dr. Thyra Breed as to weaning some of her medications.  The more likely candidates for drug-induced phenomena would be trazodone 200 mg q.h.s. she takes.  Additionally, she is on methadone 5 mg q.4h. up to five times a day and Neurontin 200 mg q.8h.  Additionally, she takes Vicodin ES q.6h.  She will be followed in the hospital as medical issues arise.  Please do not hesitate to call. Dictated by:   Titus Dubin. [name], M.D. [facility] Attending Physician:  Priscille Kluver,  Storm Frisk Ii DD:  07/22/01 TD:  07/23/01 Job: [id number]

## 2011-01-25 NOTE — Discharge Summary (Signed)
[name], [name]                        ACCOUNT NO.:  [id number]   MEDICAL RECORD NO.:  [id number]                   PATIENT TYPE:  INP   LOCATION:  6525                                 FACILITY:  [facility]   PHYSICIAN:  Stacie Glaze, M.D. [facility]           DATE OF BIRTH:  22-Nov-1915   DATE OF ADMISSION:  09/07/2003  DATE OF DISCHARGE:  09/09/2003                                 DISCHARGE SUMMARY   DISCHARGE DIAGNOSES:  1. Syncope.  2. Mild anemia.  3. Urinary tract infection.  4. Chronic pain.   BRIEF ADMISSION HISTORY:  Ms. Starke is an 77 year old white female [place]  [place] she had a sudden episode of weakness associated with  slurred speech.  She states that she closed her eyes and may have passed  out.  Upon arrival, EMS found the patient alert and oriented x 3 with no  focal deficit.  The patient does not remember what happened.   PAST MEDICAL HISTORY:  Well documented in previous admissions that include:  1. Paroxysmal atrial fibrillation.  2. Polymyalgia rheumatica.  3. COPD.  4. Diverticular disease.  5. Degenerative joint disease with chronic low back pain.  6. Partial gastrectomy.  7. Thyroid nodule.  8. Status post catheterization in 2004 revealing an EF of 25% with 3+ mitral     regurgitation, status post MI in April of 2004 secondary to spasm.  9. Rotator cuff repair in October of 2004.  10.      Bilateral knee replacements.   HOSPITAL COURSE:  #1 - SYNCOPE:  Etiology not clear.  No evidence of  ischemia or arrhythmia.  No evidence of pulmonary embolus.  An  echocardiogram was unremarkable for evidence of embolus.  On telemetry, the  patient has shown some bradycardia and a bifascicular block and it may have  been that the trazodone precipitated the syncope.  Therefore, the trazodone  is going to be discontinued.   #2 - INFECTIOUS DISEASE:  The patient had evidence of urinary tract  infection and has been started on Cipro.   LABORATORIES AT  DISCHARGE:  BUN 25, creatinine 1.2.  Coagulation times were  normal.  Hemoglobin 10.7, white count 3.9.  Urinalysis consistent with a  urinary tract infection.  No culture at this time.  Cardiac enzymes  negative.   MEDICATIONS AT DISCHARGE:  1. Ambien 5 mg h.s. p.r.n. sleep.  2. Cipro 250 mg b.i.d. for an additional three days.  3. Neurontin 100 mg b.i.d.  4. Potassium chloride 20 mEq daily.  5. Lasix 20 mg daily.  6. Accolate 20 mg daily.  7. Prednisone 5 mg daily.  8. Methadone 5 mg q.6h.  9. Prozac 20 mg daily.  10.      Nasal spray as at home.  11.      Serevent 50 mg as at home.  12.      Pulmicort 200 mg daily.  13.      Aspirin as at home.  14.      Pravachol 80 mg daily.   SPECIAL INSTRUCTIONS:  The patient has been instructed not to take  trazodone.      Cornell Barman, P.A. [facility]                  Stacie Glaze, M.D. [facility]    LC/MEDQ  D:  09/09/2003  T:  09/09/2003  Job:  [id number]   cc:   Titus Dubin. [name], M.D. [facility]

## 2011-01-25 NOTE — Cardiovascular Report (Signed)
[name], [name]                        ACCOUNT NO.:  [id number]   MEDICAL RECORD NO.:  [id number]                   PATIENT TYPE:  INP   LOCATION:  2921                                 FACILITY:  [facility]   PHYSICIAN:  Charlies Constable, M.D. [facility]              DATE OF BIRTH:  23-Aug-1916   DATE OF PROCEDURE:  11/26/2002  DATE OF DISCHARGE:                              CARDIAC CATHETERIZATION   PROCEDURE:  Cardiac catheterization.   CLINICAL HISTORY:  The patient is 77 years old and has no prior history of  known heart disease.  She was hospitalized after a fall a few weeks ago and  has had decreased activity since that time.  Today, she developed chest pain  and was seen by Dr. [name] in the office and admitted to the hospital.  She  had continued pain and her troponin was positive.  Her EKG showed right  bundle branch block and what appeared to be an old DMI, but no acute ST-T  changes.  Because of persistent pain and positive troponin, we brought her  to the cath lab for emergency evaluation with angiography.   PROCEDURE:  The procedure was performed via the right femoral artery using  an arterial sheath and 6-French pre-formed coronary catheters.  A front wall  arterial puncture was performed and Omnipaque contrast was used.  The  patient tolerated the procedure well and left the laboratory in satisfactory  condition.  105 mL of contrast were used.   RESULTS:  Left main:  The left main coronary artery was free of significant  disease.   Left anterior descending artery:  The left anterior descending artery gave  rise to a moderately large diagonal branch, 2 septal perforators, and 2  small diagonal branches.  The LAD was irregular, but there was no major  obstruction.   Left circumflex artery:  The left circumflex artery gave rise to a large  marginal branch and 3 posterolateral branches and an __________branch.  These vessels were free of significant disease.   Right  coronary artery:  The right coronary artery gave rise to a conus  branch, 2 right ventricular branches, a posterior descending branch, and 2  posterolateral branches.  The right coronary artery was irregular, but there  was no major obstruction.   Left ventriculogram:  The left ventriculogram performed in the RAO  projection showed a large area of apical akinesis.  The anterolateral wall  near the base and the mid inferior wall and inferior wall near the base  moved well.  The estimated ejection fraction was 25% to 30%.  There was 2 to  3+ mitral regurgitation.   The aortic pressure was 102/52 with a mean of 72, and the left ventricular  pressure was 102/23.   CONCLUSIONS:  1. Minimal nonobstructive coronary artery disease with irregularities in the     left anterior descending and right coronary arteries.  2. A  large area of apical akinesis with depressed left ventricular function     and 2 to 3+ mitral regurgitation.   RECOMMENDATIONS:  It appears the patient has had a large anterior wall  myocardial infarction, most probably related to spasm of the LAD.  She also  has associated 2 to 3+ mitral regurgitation.  We will plan medical  management.  We will treat her with aspirin, Plavix, low-dose nitroglycerin  because of pressures marginal.  As soon as we know her renal function is  stable following the contrast, we will treat her with an ACE inhibitor, and  we will  start low-dose beta blockers today.  I will resume her heparin 12 hours  after the procedure for probably another 24 hours, and will probably get an  echocardiogram in a couple of days to reassess her LV function.  Hopefully,  her LV will improve since her artery is now patent.                                                Charlies Constable, M.D. [facility]    BB/MEDQ  D:  11/26/2002  T:  11/28/2002  Job:  [id number]   cc:   Titus Dubin. [name], M.D. [facility]   Pricilla Riffle, M.D. [facility]   Cardiac Catheterization Lab

## 2011-01-25 NOTE — Consult Note (Signed)
[facility]  Patient:    ZIPPORAH, FINAMORE                     MRN: [id number] Proc. Date: 06/24/00 Adm. Date:  [id number] Attending:  Thyra Breed CC:         Titus Dubin. [name], M.D. [facility]  Aundra Dubin, M.D.   Consultation Report  FOLLOWUP EVALUATION:  Abiha comes in for followup evaluation.  We had placed her on Duragesic, which initially was helpful, but then she developed progressive chest pain and shortness of breath as well as peripheral edema. She ended up being hospitalized from June 17, 2000 to June 20, 2000 by Dr. Titus Dubin. Hopper and Dr. Stacie Glaze and worked up.  At that time, her sedimentation rate was 11.  Her Duragesic patch was taken off of her and she was placed on hydrocodone.  She did develop problems with asthma and was treated vigorously for this.  In reviewing the discharge summary, it appears that her cardiac function was intact.  A spiral CT of her chest did not suggest an underlying pulmonary embolus but did note that she had leaking of her breast implants and she was seen by Dr. Lacretia Nicks. Delia Chimes for this.  On discharge, she was placed on hydrocodone, which has helped to control her pain fairly well, although she is developing increasing problems with stiffness which lasts up to an hour.  She feels as though her polymyalgia rheumatica may be recurring but of note, her sedimentation rate was 11 while in the hospital.  The patient tells me that she was miserable on the OxyContin with anger issues which evolved and were amassed by this.  She states the Duragesic was helpful but it may have been too much for her at 50 mcg and she did get the shortness of breath, which occurs up to 10% of the patients that use this.  She currently presents with a diffuse aching and right knee and hip discomfort.  She uses about one to two tablets of the hydrocodone per day and does feel as though this is  helpful.  EXAMINATION  VITAL SIGNS:  Blood pressure 108/68.  Heart rate is 82.  Respiratory rate is 18.  O2 saturation is 94%.  Pain level is 3/10.  GENERAL:  Her disposition is very positive.  NEUROLOGIC:  Her neuro exam is grossly unchanged from previously.  EXTREMITIES:  She does have bilateral edema of the lower extremities.  IMPRESSION 1. Chronic low back pain syndrome on the basis of lumbar spinal stenosis with    underlying lumbar spondylosis. 2. Polymyalgia rheumatica, for which she has been seen by    Dr. Aundra Dubin in the past. 3. Other medical problems per Dr. [name].  DISPOSITION 1. Since the patient is relatively stable on the hydrocodone, I went ahead and    wrote for hydrocodone 10/325 mg 1 p.o. q.8h. p.r.n., #90 with no refill. 2. I encouraged her to try apple cider vinegar with honey twice a day to see    whether this would reduce some her discomfort. 3. Follow up with me in four weeks. DD:  06/24/00 TD:  06/24/00 Job: [id number]

## 2011-01-25 NOTE — H&P (Signed)
[facility]  Patient:    ADRINA, ARMIJO                     MRN: [id number] Adm. Date:  [id number] Attending:  Thyra Breed CC:         Aundra Dubin, M.D.   History and Physical  FOLLOWUP EVALUATION  Adel comes in for followup and consideration for a third epidural.  In the interim, she has noted that she has gained four pounds.  She has had a big appetite and she feels as though her temper is shorter.  She is asking whether this could be coming from the OxyContin or from other medications.  I advised her that it was likely coming from the corticosteroids that had been injected into her epidural space and that we would hold off injecting today.  She does seem to be tolerating the OxyContin although she feels as if she breaks through after about six hours.  I advised her that we may need to consider going up on the dose.  She states this would be a lot easier for her to keep track of.  We discussed also the possibility of doing a facet joint block, but I advised her that at this point I wished to go ahead and hold off until we see how she does on the higher dose of OxyContin that we discussed today.  PHYSICAL EXAMINATION:  VITAL SIGNS:  Blood pressure 125/91, heart rate 69, respiratory rate 16, O2 saturation 100%, temperature 97.3, pain level is 4/10.  IMPRESSION: 1. Lumbar spinal stenosis with a lumbar spondylosis. 2. Other medical problems per Dr. Kellie Simmering.  DISPOSITION: 1. Hold off on lumbar epidural steroid injection today. 2. Increase OxyContin to 10 mg two tablets in the morning and two tablets 12    hours later. 3. Follow up with me in three weeks at which time we will consider continuing    at the current dose of OxyContin and/or consider facet joint nerve blocks. DD:  04/30/00 TD:  04/30/00 Job: [id number]

## 2011-01-25 NOTE — Consult Note (Signed)
[facility]  Patient:    [name], [name]                     MRN: [id number] Proc. Date: 10/17/00 Attending:  Loura Halt Ii CC:         Titus Dubin. [name], M.D. [facility]  Aundra Dubin, M.D.   Consultation Report  FOLLOW-UP EVALUATION:  [name] comes in for follow-up evaluation of her chronic low back pain on the basis of lumbar spondylosis with underlying spinal stenosis. Since her last evaluation, her pain has been mildly increased to a degree but she has also developed a lot of right knee discomfort. She developed increased swelling of her hands and saw Dr. Kellie Simmering recently who placed her back on corticosteroids. She did not get her knee injected and she is asking about that today. She continues on the methadone at 5 mg q. 8h and seems to be tolerating that well.  PHYSICAL EXAMINATION:  Blood pressure 108/50, heart rate 73, respiratory rate 20, O2 saturations 92%, pain level is 5/10. She demonstrates some crepitus of her right knee with increased pain on pressure over her patella. Deep tendon reflexes were symmetric in the lower extremities.  IMPRESSION: 1. Low back pain on the basis of lumbar spondylosis and spinal stenosis. 2. Probable left cervical radiculopathy with cervical spondylosis. 3. Right knee pain most likely on the basis of degenerative joint disease. 4. Other medical problems per Dr. [name].  CURRENT MEDICATIONS:  Trazodone, Accolate, E-Vista, Serevent, Prozac, and her methadone.  DISPOSITION: 1. Continue methadone 5 mg one p.o. q. 8h #90 with no refill. 2. Continue other medications. 3. I went ahead and prepped out her right knee with Betadine x 3 and injected    3 cc of 1% lidocaine with 40 mg of Medrol using a 22 gauge needle. The    patient tolerated this well. She noted immediate improvement in her    pain. 4. Followup with me in 8 weeks. DD:  10/17/00 TD:  10/18/00 Job: [id number]

## 2011-01-25 NOTE — H&P (Signed)
[place]  Patient:    [name], [name] Visit Number: [id number] MRN: [id number]          Service Type: Attending:  Carlisle Beers. Dorothyann Gibbs, M.D. Dictated by:   Jamelle Rushing, P.A.-C. Adm. Date:  07/20/01                           History and Physical  DATE OF BIRTH:  1916/03/10  CHIEF COMPLAINT:  Right knee pain for the past two to three years.  HISTORY OF PRESENT ILLNESS:  The patient is an 77 year old white female with a two to three-year history of progressively worsening right knee pain.  The patient states that she does have mechanical symptoms such as grinding, and she has significant difficulty with range of motion of the knee, due to the pain and grinding.  The patient describes the pain as a sharp pain with radiation into the hip and lower back with range of motion.  She does not have pain at night.  She does have clicking, popping, and grinding.  She does have a significant swelling in the knee, and she has been using a cane for the past two to three years.  The patient does have a history of a left total knee arthroplasty in 1997, with good results.  ALLERGIES:  PENICILLIN, MORPHINE.  The patient reports having had a resuscitative code called on her due to one single dose of morphine, in which her respirations stopped.  CURRENT MEDICATIONS  1. Vicodin DS one tab q.6h.  2. Pulmicort two sprays b.i.d.  3. Prozac 20 mg p.o. q.d.  4. Trazodone 200 mg p.o. q.h.s.  5. Accolate 20 mg b.i.d.  6. Potassium chloride 10 mEq p.o. q.d.  7. Demodex 20 mg p.o. q.d.  8. Maxair p.r.n.  9. Serevent two sprays b.i.d. 10. Evista 60 mg p.o. q.d. 11. Methadone 5 mg q.4h., not more than five times a day. 12. Neurontin 200 mg p.o. q.8h. 13. Prednisone 5 mg p.o. q.d.  PAST MEDICAL HISTORY:  Asthma, followed by Dr. Titus Dubin. Hopper, triggered by perfume, strong scents.  The patient has been hospitalized in the past and placed in the ICU, but no  ventilatory support.  Last episode of asthma attack was approximately two years ago.  PAST SURGICAL HISTORY  1. C-section x 3.  2. Gastric surgery.  3. Hysterectomy.  4. Cholecystectomy.  5. Left total knee replacement.  6. Bilateral mastectomy.  7. Two toes amputated.  8. Breast implants removed.  9. Right ankle surgery for a fracture. 10. Left eye retinal detachment. 11. Appendectomy. 12. Right wrist carpal tunnel.  The patient denies any complications other than the respiratory arrest related to the morphine.  SOCIAL HISTORY:  The patient is an 77 year old white female.  She denies any history of smoking.  She does have about three alcoholic beverages on the weekend.  The patient is currently widowed, with five grown children.  She lives in a [place] house with five steps to the main entrance.  She is a retired [employer] trader.  FAMILY PHYSICIAN:  Dr. [name].  FAMILY HISTORY:  Mother is deceased at age 29 years of age from necrotic bowel.  Father is deceased from old age.  One brother deceased from emphysema and cardiac disease.  Two sisters deceased from Alzheimers, heart failure, and a history of colon cancer.  The patient has one brother alive, at the age of 38, in good  medical health.  REVIEW OF SYSTEMS:  Positive for glasses required for reading.  Bilateral hearing aids.  Shortness of breath with exertion, related to her asthma and deconditioning.  Chronic constipation, relieved by Senokot, due to her chronic high-dose pain medication.  Easy bruising, related to the prednisone.  Chronic low back pain, on chronic medications for this problem.  Otherwise the review of systems is negative for any other sensory, cardiac, GI, GU, respiratory, hematologic, musculoskeletal, neurological, or mental status problems at this time.  PHYSICAL EXAMINATION  VITAL SIGNS:  Height 5 0 inches, feet, weight 145 pounds, pulse 64 and regular, respirations 14, temperature 97.0 degrees,  blood pressure 112/60.  GENERAL:  This is a healthy-appearing well-developed, pleasant white female, meticulously dressed.  She ambulates very slowly with a right-sided limp.  She is able to get herself on and off the exam table without much difficulty, and appears to be in no significant distress while doing so.  HEENT:  Head normocephalic, atraumatic, nontender over the maxillary or frontal sinuses.  Pupils equal, round, reactive, constricted.  Conjunctivae pink and moist.  Sclerae anicteric.  External ears without deformities. Canals patent.  Tympanic membranes pearly gray and intact.  Gross hearing is intact with the use of hearing aids bilaterally.  Nasal septum was midline. Mucous membranes pink and moist.  No polyps noted.  Oral buccal mucosa pink and moist, without lesions. Upper and lower dentition in good repair.  The patient was able to swallow without any difficulty or tenderness.  NECK:  Supple, no palpable lymphadenopathy.  Thyroid gland was nontender.  The patient had good range of motion of her cervical spine without difficulty or tenderness.  CHEST:  Lung sounds clear and equal bilaterally.  No wheezes, rales, rhonchi, or rubs noted.  HEART:  A regular rate and rhythm.  S1, S2 auscultated.  No murmurs, rubs, or gallops noted.  ABDOMEN:  Round, soft, nontender.  Bowel sounds normoactive and throughout. No hepatosplenomegaly.  CVA was nontender to percussion.  EXTREMITIES:  Upper extremities were symmetric in size and shape.  The patient has a good range of motion of her shoulders, elbows, and wrists without any difficulty or tenderness.  Lower extremities:  Right and left hip had full extension, flexion up to 120 degrees, 20-30 degrees of internal and external rotation, without any difficulty or tenderness.  Right knee had a 10-degree valgus deformity.  The knee was round and boggy-appearing.  There was no sign of erythema, ecchymosis, or palpable effusion.  She  was extremely tender along the medial  and lateral joint line.  She had full extension and flexion to 95 degrees. The left knee was minimally swollen-appearing.  There was no sign of erythema or ecchymosis, no palpable effusion.  She was slightly tender along the medial and lateral joint line, but not nearly as severe as on the right.  She had full extension and flexion back to 115 degrees.  No valgus or varus laxity, no anterior or posterior drawer.  PERIPHERAL VASCULAR:  Carotid pulses were 2+, radial pulses 2+.  Unable to palpate any lower extremity pulses due to significant lower extremity edema, but there was no venous stasis changes.  The feet were warm and pink.  NEUROLOGIC:  The patient was conscious, alert, and appropriate.  She held an easy conversation with the examiner.  Cranial nerves II-XII were grossly intact.  Deep tendon reflexes of the upper and lower extremities were symmetrical right to left.  The patient was grossly intact to light  touch sensation from head to toe.  BREASTS/RECTAL/GU:  Exams deferred at this time.  IMPRESSION 1. End-stage osteoarthritis with slight valgus deformity of the right knee. 2. History of asthma. 3. Chronic prednisone use.  PLAN:  The patient will be admitted to [facility] under the care of Dr. [name] L. Rendall III on July 20, 2001.  The patient will undergo all routine labs and tests prior to having a right total knee arthroplasty performed. Dictated by:   Jamelle Rushing, P.A.-C. Attending:  Carlisle Beers. Dorothyann Gibbs, M.D. DD:  07/13/01 TD:  07/14/01 Job: [id number]

## 2011-01-25 NOTE — Discharge Summary (Signed)
[name], [name]              ACCOUNT NO.:  [id number]   MEDICAL RECORD NO.:  [id number]          PATIENT TYPE:  INP   LOCATION:  6735                         FACILITY:  [facility]   PHYSICIAN:  Melissa S. Peggyann Juba, NPDATE OF BIRTH:  November 08, 1915   DATE OF ADMISSION:  01/31/2005  DATE OF DISCHARGE:                                 DISCHARGE SUMMARY   ADDENDUM:  Please add Avelox 400 mg p.o. daily x10 days to MEDICATIONS AT  DISCHARGE on discharge summary dictated on Feb 05, 2005.      MSO/MEDQ  D:  02/05/2005  T:  02/05/2005  Job:  [id number]

## 2011-01-25 NOTE — H&P (Signed)
[facility]  Patient:    [name], [name]                     MRN: [id number] Adm. Date:  [id number] Attending:  Thyra Breed CC:         [name] L. Dorothyann Gibbs, M.D.             Aundra Dubin, M.D.             Titus Dubin. [name], M.D. [facility]                         History and Physical  CHIEF COMPLAINT:  [name] is a very pleasant 77 year old, who was sent to [place] by Dr. Aundra Dubin, for evaluation and recommendations with regard to pain management.  HISTORY OF PRESENT ILLNESS:  The patient served as historian with her daughter.  She has records which were forwarded to [place] from Dr. Kellie Simmering.  The patient has a  long history of low back pain which she described as being present for several years.  Two years ago it increased in severity and apparently she underwent an RI scan, and was seen by a neurosurgeon.  At that time, she had multilevel degenerative disk disease, facet joint arthritis, with moderately-severe spinal  stenosis at L3-4.  She underwent a series of epidural steroid injections.  The physician who performed this, she could not recall.  She did not feel as though  they were very helpful.  Over the past four weeks, she noted a marked increase n pain in her right hip and knee region.  She initially had injections by Dr. [name] L. Rendall III about 10 days ago in her hip, and this did not improve.  On Tuesday she underwent injections into her knee and hip again, and for the past couple of days she has felt markedly improved.  She rates her pain at 5/10, and states that this is a fairly good level of pain control.  She denied numbness or tingling, bowel or bladder incontinence, or weakness; although she does note that she had intermittent giveaway weakness in the right lower extremity at the knee and hip.  She notes hat her pain is made worse by walking, in the right hip, and rest does not improve this.  She does note  that she has improvement if she lays on her back, with a knee pillow.  She is taking hydrocodone 7.5 mg up to two tablets q.d., and states that this significantly controls her discomfort.  Additionally, four weeks ago, the patient increased her prednisone which she takes for polymyalgia rheumatica, and this did not reduce her back discomfort and her hip discomfort.  She is back down to 2.5 mg q.d.  CURRENT MEDICATIONS: 1. Accolate. 2. Prozac. 3. Serevent. 4. Pulmicort. 5. Trazodone. 6. Lasix. 7. Evista. 8. Prednisone. 9. Hydrocodone.  ALLERGIES:  PENICILLIN causes respiratory difficulty.  MORPHINE sounds like it causes respiratory depression.  She is allergic to LOBSTER.  FAMILY HISTORY:  Positive for coronary artery disease, asthma, cancer, and osteoarthritis.  ACTIVE MEDICAL PROBLEMS: 1. Polymyalgia rheumatica. 2. Asthma. 3. Osteoporosis. 4. Leg edema. 5. History of breast cancer, status post resection with reconstruction. 6. Osteoarthritis.  PAST SURGICAL HISTORY: 1. Significant for a partial gastrectomy. 2. Bilateral mastectomies with reconstruction in 1988. 3. Left total knee replacement by Dr. Priscille Kluver. 4. Cholecystectomy. 5. 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 6. Hysterectomy. 7. Appendectomy. 8. Tonsillectomy and adenoidectomy.  SOCIAL HISTORY:  The patient is a nonsmoker.  She is a social drinker.  She currently lives with her daughter.  REVIEW OF SYSTEMS:  GENERAL:  Negative.  HEAD:  Significant for left occipital headaches which have been more of a problem the past few days.  EYES: Significant for blindness in the left eye.  NOSE/MOUTH/THROAT:  Negative.  EARS: Significant for decreased hearing acuity.  She currently has hearing aids.  CARDIOVASCULAR:  Negative.  LUNGS:  Significant for asthma.  GI:  History of a partial gastrectomy. GU:  Negative.  MUSCULOSKELETAL:  See the HPI for pertinent  positives. NEUROLOGIC: No history of seizure or stroke.  HEMATOLOGIC:  Negative.  CUTANEOUS: Negative. ENDOCRINE:  Significant for osteoporosis.  Negative for glucose intolerance or thyroid problems.  PSYCHIATRIC:  Positive for depression.  She is currently on medications for this.  ALLERGY:  She has a remote history of significant allergic problems.  PHYSICAL EXAMINATION:  VITAL SIGNS:  Blood pressure 127/62, heart rate 73, respirations 18, O2 saturation 93%.  Pain level is 5/10.  Temperature 96.8 degrees.  GENERAL:  This is a very pleasant 77 year old who looks her stated age.  HEENT:  Head normocephalic.  She did have tenderness over the left greater occipital groove.  Eyes:  Extraocular movements significant for dysconjugate gaze on upward gaze with the left eye drifting outward.  She has blindness in her left eye.  Conjunctivae and sclerae clear.  Nose:  Septal deviation to the right. Oropharynx:  Demonstrates good dentition.  NECK:  Demonstrates good range of motion with carotids 2+ and symmetric, without bruits.  LUNGS:  Clear.  HEART:  A regular rate and rhythm.  BREASTS/ABDOMEN/PELVIC/RECTAL:  Examinations were not performed.  EXTREMITIES:  The patient demonstrates a good range of motion of her shoulders,  elbows, and wrists, with some bony enlargement of the first carpal metacarpal joints of the hands, as well as the DIPs.  Radial pulses 2+ and symmetric.  Lower extremities demonstrate evidence of previous surgery on the left knee, with a well-healed surgical scar, right knee with minimal changes.  She had edema of the lower extremities, which is minimally pitting.  She had evidence of varicosities over the dorsum of the feet, with hammertoes, bony enlargement of the first carpal metacarpal joints.  Dorsalis pedis pulses and radial pulses 2+ and symmetric.  NEUROLOGIC:  The patient was oriented to person, place, and reason for visit. Cranial nerves II-XII  were significant for blindness of the left eye and decreased  hearing acuity.  Deep tendon reflexes were symmetric in the upper and lower extremity with downgoing toes.  Motor was 5/5 with symmetric bulk and tone. Sensation intact to pinprick, light touch, and vibratory sense.  BACK:  Revealed kyphosis of the dorsal spine with mild scoliosis of the thoracolumbar spine.  MRI interpretation from November 15, 1998, demonstrated moderate spinal stenosis at  L3-4, with multilevel degenerative disk disease and facet joint arthritis, with  neural foraminal stenosis.  IMPRESSION: 1. Right hip and knee pain, which may be emanating from her back, since    she does have significant changes on the MRI, suggestive of spinal stenosis,    facet joint arthritis, and degenerative disk disease, versus intrinsic    joint disease, and she has responded to intra-articular injections recently. 2. Multiple other medical problems, including polymyalgia rheumatica. 3. Asthma. 4. Osteoporosis. 5. History of breast cancer. 6. Osteoarthritis. 7.  Depression. 8. Peripheral edema, problems per Dr. Titus Dubin. Hopper and Dr. Kellie Simmering.  DISPOSITION: 1. Since the patient is doing well in her current medical regimen, I did not    advocate changing anything at this time. 2. Since the patient is relatively improved symptomatically, I encouraged her    that we hold off on any injections today. 3. Follow up with me in six months. 4. The patient is encouraged to follow up with Dr. Kellie Simmering and with Dr. [name]    with regard to her other medical problems. DD:  02/29/00 TD:  02/29/00 Job: [id number]

## 2011-01-25 NOTE — Op Note (Signed)
[name], [name]                        ACCOUNT NO.:  [id number]   MEDICAL RECORD NO.:  [id number]                   PATIENT TYPE:  INP   LOCATION:  2899                                 FACILITY:  [facility]   PHYSICIAN:  [name] L. Rendall III, M.D.           DATE OF BIRTH:  10/30/1915   DATE OF PROCEDURE:  11/14/2003  DATE OF DISCHARGE:                                 OPERATIVE REPORT   PREOPERATIVE DIAGNOSIS:  Osteoarthritis, left shoulder with massive rotator  cuff tear.   POSTOPERATIVE DIAGNOSIS:  Osteoarthritis, left shoulder with massive rotator  cuff tear.   OPERATION PERFORMED:  CTA hemiarthroplasty, left shoulder.   SURGEON:  [name] L. Priscille Kluver, M.D.   ASSISTANT:  Jamelle Rushing, P.A.   ANESTHESIA:  General.   PATHOLOGY:  The patient has no supraspinatus and osteoarthritis of the  humeral head and glenoid without erosion with chronic pain resistant to an  arthroscopic debridement and all conservative measures.   DESCRIPTION OF PROCEDURE:  Under general anesthesia, the left shoulder was  prepared with DuraPrep and draped as a sterile field.  An oblique incision  was made from the clavicle just above the coracoid to the deltoid insertion.  Dissection was carried down through skin and subcutaneous tissue.  Multiple  small vessels were cauterized.  Deltopectoral groove was identified and  developed.  The [place]ov[place] retractor was used to elevate the deltoid from the  humeral head laterally.  The supraspinatus was deficient.  The subscapularis  was present, enlarged.  Vessels along the inferior portion of it were  cauterized.  Musculocutaneous nerve was felt behind the conjoined tendon.  The subscapularis tendon was then dissected free from near its insertion and  the axillary nerve was palpated inferiorly.  It was then dissected free from  the capsule.  The small curved retractor was then placed behind the humeral  head.  The plastic retractor was placed between the glenoid  and humeral head  and the humeral head was released anteriorly.  In this manner it was  osteotomized according to a guide.  Spurs at the inferior humeral neck were  then resected.  Reaming of the humeral canal was then done from 6A 10 to 12.  12 was a good fit.  The rasps were then used and the 12 rasp was a good fit,  so a 12 body and 12 stem size component is then templated.  Cuts were then  made for the CTA head and rongeur was used to remove any little bone  spicules around the superior greater tuberosity.  Once this was completed,  trial fittings of several humeral heads were done and the 18 by 48 was the  best fit.  It does not overstuff the joint and appears generally quite  stable.  Permanent components were then obtained.  Bone graft was obtained  from the humeral head and as the stem was inserted, this bone graft  was  impacted to give good solid fit in what had been a near hollow bone canal in  the proximal humerus.  Once the stem was seated, the CTA head was impacted.  It should be noted that #1 Ethibond sutures were placed along the medial  humeral neck prior to seating of the stem.  These were then used to suture  the subscapularis directly to it with all of them tied.  The shoulder has  good range of motion with excellent stability.  The deltopectoral groove was  then reapproximated by a fascial stitch of #1 Vicryl, subcutaneous tissue  with 2-0 Vicryl and skin with clips.   OPERATIVE TIME:  An hour and 20 minutes.  The patient tolerated the  procedure well and returned to recovery in good condition.                                               [name] L. Dorothyann Gibbs, M.D.    Renato Gails  D:  11/14/2003  T:  11/14/2003  Job:  [id number]

## 2011-01-25 NOTE — Discharge Summary (Signed)
NAMEVADIE, PRINCIPATO              ACCOUNT NO.:  [id number]   MEDICAL RECORD NO.:  [id number]          PATIENT TYPE:  INP   LOCATION:  1328                         FACILITY:  [facility]   PHYSICIAN:  Bruce Rexene Edison. Swords, MD    DATE OF BIRTH:  1915/09/28   DATE OF ADMISSION:  07/30/2006  DATE OF DISCHARGE:  08/01/2006                               DISCHARGE SUMMARY   DISCHARGE DIAGNOSES:  1. Presumed pneumonia.  2. Recurrent cellulitis (methicillin-resistant Staphylococcus aureus).  3. Osteoarthritis.  4. Status post bilateral total knee arthroplasties and left shoulder      arthroplasty.  5. History of breast cancer, status post bilateral mastectomy.  6. Chronic pain syndrome followed by pain management.  7. History of monoclonal gammopathy of unknown significance.  8. Chronic obstructive pulmonary disease.  9. History of vasospastic myocardial infarction.  10.Chronic anemia.  11.History of paroxysmal atrial fibrillation previously deemed not to      be a Coumadin candidate.  12.History of hyperlipidemia.  13.History of polymyalgia rheumatica.  14.History of bilateral thyroid nodules.  15.Renal insufficiency.   ALLERGIES:  MORPHINE, PENICILLIN, and MOXIFLOXACIN.   DISCHARGE MEDICATIONS:  1. Zithromax 250 mg p.o. daily for 4 days.  2. Pravachol 80 mg p.o. daily (to hold until after finished with      azithromycin).  3. Prozac 20 mg daily.  4. Methadone 5 mg b.i.d.  5. Torsemide 20 mg p.o. daily.  6. K-Dur 20 mEq p.o. daily.  7. Trazodone 100 mg p.o. nightly.  8. Accolate 20 mg p.o. b.i.d.  9. Aspirin 81 mg p.o. daily.  10.Maxair inhaler 2 puffs b.i.d.  11.Serevent inhaler 2 puffs b.i.d.  12.Miacalcin nasal spray 1 puff alternating nostrils daily.   HOSPITAL PROCEDURES:  Chest x-ray demonstrated persistent opacity at the  left costophrenic angle.  This was seen on previous CAT scan.  This  could represent recurrent infiltrate or possibly localized malignancy.  There is blunting  of the posterior costophrenic angles, possible pleural  effusions.  There is a density along the right major fissure consistent  with atelectasis.   HOSPITAL LABORATORIES:  Blood cultures negative to date.  Urine  microscopy:  7-10 white blood cells per high power field.  Urine  culture:  Pending.  CMET on admission normal except for a glucose of 129  and a creatinine of 1.5.  CBC on admission was normal except for a  hemoglobin of 10.2.   HOSPITAL COURSE:  The patient was admitted to the hospital service on  July 30, 2006; see admission note for detail.  Patient admitted with  cough and congestion.  She was treated with IV antibiotics initially.  The day after admission she was improving; her medications were changed  to p.o.  She tolerated this well.  At the time of discharge she was  ambulating in the halls without difficulty.  She had no shortness of  breath, her cough had minimized.  We will resume her home medications  and she will follow up with Dr. [name] in 1 week.      Bruce Rexene Edison Swords, MD  Electronically Signed  BHS/MEDQ  D:  08/01/2006  T:  08/01/2006  Job:  [id number]   cc:   Titus Dubin. [name], MD,FACP,FCCP  [phone]  [place] [address]

## 2011-01-25 NOTE — H&P (Signed)
[facility]  Patient:    [name], [name]                     MRN: [id number] Adm. Date:  [id number] Disc. Date: [id number] Attending:  Loura Halt Ii CC:         [name]. [name], M.D. [facility]  Aundra Dougherty, M.D.  [name] L. Dorothyann Gibbs, M.D.   History and Physical  [name] comes in for followup evaluation of her back and lower extremity pain on the basis of lumbar spondylosis with underlying lumbar ______.  Since her last evaluation she states she has done good and does not want to change any of her medications today.  She did see Dr. Kellie Simmering who has recently x-rayed her neck yesterday which showed diffuse degenerative disk and facette joint arthritis in the cervical region with a mild compression fracture at T3.  She is having minimal side effects to her current medications.  CURRENT MEDICATIONS:  Prozac, Trazodone, Evista, accolade, K-Dur, Demodex, methadone 5 mg q.4h. during the day, Serevent, Maxair, hydrocodone 7.5/500 q.6h. p.r.n.  She has had surgery on her feet to remove her second toe and feels very comfortable with this.  Her left knee continues to bother her and Dr. Priscille Kluver has recommended that she consider knee replacement.  PHYSICAL EXAMINATION  VITAL SIGNS:  Blood pressure 102/67, heart rate 90, respiratory rate 18, O2 saturation 97%, pain level 4/10.  EXTREMITIES:  She has crepitus and range of motion of her left knee.  She has good healing over her surgical sites on her feet.  Deep tendon reflexes were symmetric at the knees with negative straight leg raise signs.  IMPRESSION: 1. Lumbar spinal stenosis on the basis of lumbar spondylosis. 2. Cervical spondylosis. 3. Compression fracture at T3. 4. Other medical problems per Dr. Marga Melnick.  DISPOSITION: 1. Continue on current medications.  I wrote prescriptions for methadone 5 mg    one p.o. q.4-6h. #120 and hydrocodone 7.5/500 one p.o. q.6h. p.r.n.  breakthrough pain #100. 2. Follow-up with me in eight weeks.  She is to let us know when she is    running low so we can get another prescription in the interim. DD:  03/04/01 TD:  03/04/01 Job: [id number]

## 2011-01-25 NOTE — Procedures (Signed)
[place]  Patient:    [name]                      MRN: [id number] Proc. Date: 04/09/00 Adm. Date:  [id number] Attending:  Thyra Breed CC:         Aundra Dubin, M.D.   Procedure Report  PROCEDURE: Lumbar epidural steroid injection.  DIAGNOSIS: Lumbar spinal stenosis with lumbar spondylosis.  ANESTHESIOLOGIST: Thyra Breed, M.D.  INDICATIONS FOR PROCEDURE: Farhiya was doing quite well on her initial evaluation of February 29, 2000.  She has noted over the past three weeks progressive increased discomfort which was radiating up into the intrascapular regions.  She has been to a chiropractor who did not feel that he had much to offer her.  She complains of right knee discomfort but no other associated neurologic symptoms of change.  She continues to take her Vicodin 7.5/750 1 t.i.d., Accolate, Prozac, Serevent, Pulmicort, Trazodone, Lasix, Evista, and prednisone.  PHYSICAL EXAMINATION:  VITAL SIGNS: Blood pressure 116/51, heart rate 71, respiratory rate 18. Oxygen saturation 93%.  Pain level is 8/10.  NEUROLOGIC: The patient demonstrates prominent kyphosis of the dorsal spine but no tenderness to percussion of the vertebra.  Deep tendon reflexes were symmetric in the lower extremity.  Straight leg raising negative.  Motor 5/5. She does ambulate with a cane.  DESCRIPTION OF PROCEDURE: I discussed extensively potential options with the patient including facet joint injections, which I felt would be multiple, versus a repeat epidural steroid injection.  She has had these in the past but I advised her that sometimes adjusting the volume may increase the benefit.  After informed consent was obtained the patient was placed in the sitting position and monitored.  Her back was prepped with Betadine x 3 and a skin wheal raised at the L4-5 interspace with 1% lidocaine.  A 20 gauge Tuohy needle was introduced in the lumbar epidural  space with loss of resistance using preservative-free normal saline.  There was no CSF and no blood.  Medrol 80 mg and 10 ml of preservative-free normal saline was gently injected and the needle flushed with preservative-free normal saline, and removed intact.  Post procedure condition is stable.  DISCHARGE INSTRUCTIONS:  1. Resume previous diet.  2. Limitations of activity per instruction sheet.  3. Continue current medications with exception of Lorcet, which she is to     stop, and begin OxyContin 10 mg 1 p.o. b.i.d. (#60 with no refill).  4. Follow up with me next week to consider repeat injection. DD:  04/09/00 TD:  04/10/00 Job: [id number]

## 2011-01-25 NOTE — H&P (Signed)
NAMENARALY, FRITCHER                        ACCOUNT NO.:  [id number]   MEDICAL RECORD NO.:  [id number]                   PATIENT TYPE:  INP   LOCATION:  5154                                 FACILITY:  [facility]   PHYSICIAN:  Corwin Levins, M.D. [facility]             DATE OF BIRTH:  March 10, 1916   DATE OF ADMISSION:  01/02/2003  DATE OF DISCHARGE:                                HISTORY & PHYSICAL   CHIEF COMPLAINT:  Nausea and vomiting with infiltrate on chest x-ray with  cough, unable to take p.o. and mild hypoxia.   HISTORY OF PRESENT ILLNESS:  [name] is an 77 year old black female  here with the above.  She has a productive cough for the last 2-3 days and  chest pain with shortness of breath, though she was found to have mild  hypoxia with an O2 saturation of 89% in the ER.  She has had some vomiting  in the ER as well.  It was felt that she was unlikely to do well as an  outpatient so it was felt best to admit given her advanced age and multiple  problems and vomiting.   PAST MEDICAL HISTORY:  1. Cardiomyopathy with recent catheterization with mild coronary artery     disease only, felt not to be ischemic in particularly, per Dr. Samule Ohm,     but with probable inferior MI in March 2004.  2. 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  3. Paroxysmal atrial fibrillation.  4. PMR.  5. History of diverticular disease.  6. Chronic obstructive pulmonary disease.  7. Degenerative joint disease.  8. History of thyroid nodule, 2 cm.  9. History of gastritis status post partial gastrectomy.  10.      Depression/anxiety.  11.      Chronic back pain.  12.      Anemia.  13.      Status post cholecystectomy.   ALLERGIES:  PENICILLIN, MORPHINE, TEQUIN, AMINOPHYLLINE.   CURRENT MEDICATIONS:  1. Accolate 20 mg b.i.d.  2. Prinivil 5 mg daily.  3. Trazodone 200 mg q.h.s.  4. Pulmicort 2 sprays b.i.d.  5. Serevent 2 sprays b.i.d.  6. KCl 20 mEq p.o. daily.  7. Methadone 5 mg t.i.d.  8. Pravachol 40 mg q.h.s.  9.  Digoxin 0.125 mg p.o. daily.  10.      Coumadin as directed.  11.      Protonix 40 mg daily.  12.      Prozac 10 mg p.o. daily.  13.      Demadex 20 mg daily.  14.      Captopril 6.25 mg p.o. t.i.d.   It should be noted that this medication list is inferred from last history  and physical per Dr. Samule Ohm, December 13, 2002.  The patient, herself, is not  aware of her current medication list or any of her medication names and  there is no discharge summary for the last  visit on the records available at  this time.   SOCIAL HISTORY:  She is widowed and lives with her daughter in [place].  No tobacco, no alcohol.   FAMILY HISTORY:  Noncontributory, but consistent with arthritis, ischemic  bowel, and a sister with melanoma.   PHYSICAL EXAMINATION:  GENERAL:  [name] is an 77 year old white  female, elderly, frail.  VITAL SIGNS:  O2 saturations 89%, respirations 20-24, temperature 102.2,  heart rate 85, blood pressure 91/38.  HEENT:  Sclerae anicteric.  TMs clear.  Oropharynx moderate erythema.  NECK:  No thyroid nodules.  CHEST:  Decreased breath sounds bilaterally.  No wheeze or rales.  CARDIAC:  Regular rate and rhythm, but somewhat distant and no murmur.  ABDOMEN:  Soft, nontender, positive bowel sounds.  EXTREMITIES:  No edema.   LABORATORY DATA:  UA negative, hemoglobin 10.3, white blood cell count 6.0.  I-stat with hemoglobin of 11, electrolytes within normal limits, BUN and  creatinine 14 and 1.2.  ABG, pH 7.42, PCO2 of 40.  Portable chest x-ray  tonight with new patchy right perihilar infiltrate.   ASSESSMENT AND PLAN:  1. Chronic obstructive pulmonary disease/pneumonia, new infiltrate on the     right with nausea, vomiting, and hypoxia.  She is being admitted, given     IV Rocephin and O2 per nasal cannula and nebulizers and pulmonary toilet.     We will check blood cultures.  2. Nausea, likely secondary to #1.  She is getting Phenergan p.r.n.  3. Multiple other  medical problems.  Have advised to continue all of her     medications as is.                                               Corwin Levins, M.D. [facility]    JWJ/MEDQ  D:  01/02/2003  T:  01/03/2003  Job:  [id number]   cc:   Titus Dubin. [name], M.D. [facility]

## 2011-01-25 NOTE — Discharge Summary (Signed)
[name]. [facility]  Patient:    [name], GOLONKA Visit Number: [id number] MRN: [id number]          Service Type: [facility] Location: [id number] Attending Physician:  Faith Rogue T Dictated by:   Arnoldo Morale, P.A.-C. Admit Date:  07/23/2001 Discharge Date: 07/29/2001   CC:         [name]. [name], M.D. [facility]   Discharge Summary  ADMITTING DIAGNOSES: 1. End-stage osteoarthritis, right knee. 2. Asthma with chronic prednisone use. 3. Retinal detachment, left eye. 4. Chronic constipation.  DISCHARGE DIAGNOSES: 1. End-stage osteoarthritis, right knee. 2. Asthma on chronic prednisone. 3. Chronic constipation. 4. Left eye retinal detachment. 5. Acute blood loss anemia secondary to surgery. 6. Tremor, bilateral hands.  SURGICAL PROCEDURE:  On July 20, 2001, [name] underwent a right total knee arthroplasty by Dr. [name] L. Rendall assisted by Arnoldo Morale, P.A.-C.  COMPLICATIONS:  None.  CONSULTS: 1. Physical therapy and rehab medicine consult, July 21, 2001. 2. Case management and internal medicine consult, July 22, 2001. 3. Occupational therapy consult, July 23, 2001.  HISTORY OF PRESENT ILLNESS:  This 77 year old white female patient presented to Dr. Priscille Kluver with a 2-3 year history of progressively worsening right knee pain.  The pain is a sharp pain located in the knee with radiation to the hip and low back.  There is clicking, popping, and grinding of the knee and significant problems with range of motion.  There has been some edema in the knee, and she requires a cane for ambulation.  She failed conservative treatment and because of this, she is presenting for a right total knee arthroplasty.  HOSPITAL COURSE:  [name] tolerated her surgical procedure well without immediate postoperative complications.  She was subsequently transferred to 5000.  Postop day one, T-max was 100.4.  Vitals were stable.  Hemoglobin  was 10.2 with hematocrit of 29.8.  She was started on PT per protocol.  Right knee dressing was intact without drainage.  Leg neurovascularly intact, and she was continued on IV pain medicine.  On November 13, her hemoglobin had dropped to 8.6 with hematocrit of 24.8. She was started on ferrous sulfate, and hemoglobin was monitored.  Vital signs were stable, and she was asymptomatic.  She did complain of an upper extremity tremor that Dr. Alwyn Dougherty had been evaluating, and he was consulted to help with that.  She was switched to p.o. pain medicine.  On postop day #3, T-max was 99, vitals were stable.  Right knee incision well-approximated with staples and no drainage.  Hemoglobin 8.9 with hematocrit of 26.  Anemia seemed to be stable at this time.  She did have some lethargy due to her pain medications, and that was decreased in frequency and dosage.  The tremor had improved.  Dr. Alwyn Dougherty is monitoring that.  She was felt stable for transfer to rehab and was transferred to rehab later that day.  DISCHARGE INSTRUCTIONS: 1. She is to continue her current hospitalization medications and diet with    adjustments to be made per the rehab doctors. 2. Activity:  She is to be out of bed, weightbearing as tolerated on the right    leg with the use of a walker.  She is to continue PT/OT per rehab protocol. 3. She is to have her incision cleaned with Betadine q.d.  Staples can be    removed on postop day 14.  If she is still in rehab at that time, it can be  done there.  Otherwise, she needs to follow up with Dr. Priscille Kluver at that    time.  If staples are removed in rehab, then she needs to follow up with    Dr. Priscille Kluver approximately a week or two after discharge. 4. Please notify Dr. Priscille Kluver if temperature greater than 101.5, chills, pain    unrelieved by pain medications, or foul-smelling drainage from the wound.  LABORATORY DATA:  On November 12, 20002 - hemoglobin 10.2, hematocrit 29.8. On  November 13, hemoglobin 8.6, hematocrit 24.8.  On November 14, hemoglobin 8.9, hematocrit 26.  On November 6, total protein 5.8, albumin 3.3.  On November 12, CO2 34, calcium 7.9.  On November 13, sodium 138, potassium 3.7, chloride 102, CO2 32, glucose 129, BUN 8, creatinine .9, calcium 7.5.  Urinalysis on November 6 showed few epithelials, 7-10 white cells, no red cells, and rare bacteria. All other laboratory studies were within normal limits. Dictated by:   Arnoldo Morale, P.A.-C. Attending Physician:  Faith Rogue T DD:  08/18/01 TD:  08/18/01 Job: [id number]

## 2011-01-25 NOTE — H&P (Signed)
[facility]  Patient:    [name], [name]                     MRN: [id number] Adm. Date:  [id number] Attending:  Thyra Breed CC:         Aundra Dubin, M.D.  Titus Dubin. [name], M.D. [facility]   History and Physical  FOLLOW-UP EVALUATION:  Darrel comes in today feeling fairly _____.  She does not feel as though she is doing as well as she has in the past and has not done well for awhile.  Her daughter notes that she has increased her level of activity as her pain has improved, and she seems to be outstripping the benefits of her Oxycontin 20 mg twice a day.  She does have some mild constipation from this.  The patient continues to have a lot of lower back discomfort.  She is worried she has an intra-abdominal reason for her lower back discomfort.  I advised her that her investigations of her back demonstrate that she does have spinal stenosis with facet joint arthropathy and degenerative disk disease, and this is more likely the cause of her discomfort.  PHYSICAL EXAMINATION:  VITAL SIGNS:  Blood pressure is 117/57, heart rate 81, respiratory rate 18, O2 saturation is 92%, pain level is 8 out of 10, and temperature is 98.1.  MUSCULOSKELETAL/NEUROLOGIC:  She demonstrates tenderness over the facet joints up and down the lumbar region as well as over the SI joint region.  Straight leg signs are negative.  Deep tendon reflexes are unchanged from previously.  IMPRESSION: 1. Lumbar spondylosis with stenosis. 2. Other medical problems per Dr. Kellie Simmering and Dr. [name].  DISPOSITION: 1. I advised the patient if she is concerned about her abdomen, that she    should consult with Dr. [name]. 2. Increase OxyContin to 20 mg one p.o. q.p.m. and two in the morning, #90    with no refill. 3. Follow up with me in four weeks.  She is to call me in seven to 10 days    to ascertain whether she is having any benefits.  We briefly discussed    changing her to  Duragesic and/or proceeding with facet joint nerve blocks    if she is not noticing some improvement. DD:  05/23/00 TD:  05/23/00 Job: [id number]

## 2011-01-25 NOTE — H&P (Signed)
[facility]  Patient:    [name], [name] Visit Number: [id number] MRN: [id number]          Service Type: PMG Location: TPC Attending Physician:  Thyra Breed Adm. Date:  04/30/2001   CC:         [name]. [name], M.D. [facility]  Aundra Dougherty, M.D.  [name] L. Dorothyann Gibbs, M.D.   History and Physical  FOLLOW-UP EVALUATION  [name] comes in for follow-up evaluation of her lumbar spinal stenosis on the basis of lumbar spondylosis.  Since her last evaluation she has had more problems with her lower back and her right knee, but was not able to keep her last appointment because of problems with her granddaughter and her pregnancy. She has continued on hydrocodone for breakthrough pain with the methadone and feels as though the methadone is not quite holding her as well as it has been.  She has seen Dr. Priscille Dougherty, who went ahead and injected her right knee and he had about the same success that we did when we injected it and she has had ongoing pain there.  She has not been approached for a _________ injection, and I advised her that she may wish to discuss this.  Her medical problems seem to be relatively stable.  CURRENT MEDICATIONS:  Prozac, Trazodone, Avista, Accolate, which she states has helped to keep her asthma under good control, K-Dur, Demadex, methadone, Serevent, Maxair, and hydrocodone.  PHYSICAL EXAMINATION:  VITAL SIGNS:  Blood pressure is 133/81, heart rate is 72, respiratory rate 18, O2 saturations 93%, pain level is 4/10.  EXTREMITIES:  Her right knee demonstrates crepitus on range of motion, left demonstrates well-healed surgical scar from total knee replacement.  NEUROLOGIC:  Her deep tendon reflexes were symmetric in the lower extremities. Dorsalis pedis pulses were 1-2+ and symmetric.  She does have edema of the lower extremities.  IMPRESSION: 1. Back pain syndrome on the basis of lumbar spondylosis with element of  spinal stenosis. 2. Osteoarthritis, diffuse with involvement of the right knee, especially    being symptomatic. 3. Other medical problems per Dr. Alwyn Dougherty.  DISPOSITION: 1. Increase methadone to 5 mg one p.o. q.4h. during the day with a total of    five tablets per day.  Prescription was written for 150 tablets with no    refill. 2. Hydrocodone 7.5/500 one p.o. q.6-8h. p.r.n., #100. 3. I advised the patient she needed to consult with Dr. Kellie Dougherty, her primary    rheumatologist, with regard to the possibility of investigating a    _________ injection to her knee.  She plans to see him next month. 4. Follow up with me in 4-8 weeks. Attending Physician:  Thyra Breed DD:  05/01/01 TD:  05/03/01 Job: [id number]

## 2011-01-25 NOTE — Procedures (Signed)
[facility]  Patient:    [name]                      MRN: [id number] Proc. Date: 04/17/00 Adm. Date:  [id number] Attending:  Thyra Breed CC:         Aundra Dubin, M.D.   Procedure Report  PROCEDURE:  Lumbar epidural steroid injection.  DIAGNOSIS:  Lumbar spinal stenosis with lumbar spondylosis.  INTERVAL HISTORY:  The patient noted fairly marked improvement with a combination of the epidural steroid plus low dose OxyContin up until yesterday when she began to have resumption of her discomfort. She tolerated the first injection well. Her biggest complaint today over and above her pain is her poor sleep patterns which have been a problem for quite some time. She does not feel like it was exacerbated by the steroids.  PHYSICAL EXAMINATION:  VITAL SIGNS:  Blood pressure 134/43, heart rate 63, respiratory rate 14, O2 saturations 95%, pain level is 7/10 and temperature is 97.1.  BACK:  Shows good healing from a previous injection site. Her spirits are good.  DESCRIPTION OF PROCEDURE:  After informed consent was obtained, the patient was placed in the sitting position and monitored. The patients back was prepped with Betadine x 3. A skin wheal was raised at the L4-5 interspace with 1 percent lidocaine. A 20 gauge Tuohy needle was introduced to the lumbar epidural space to loss of resistance to preservative free normal saline. There was no cerebrospinal fluid nor blood. 80 mg of Medrol and 10 ml of preservative free normal saline was very gently injected. The needle was flushed with preservative free normal saline and removed intact.  CONDITION POST PROCEDURE:  Stable.  DISCHARGE INSTRUCTIONS:  Resume previous diet. Limitations in activities per instruction sheet. Continue on current medications with the exception of increasing the OxyContin to q. 8h. Followup with me in 2 weeks for a third injection. DD:  04/17/00 TD:   04/17/00 Job: [id number]

## 2011-01-25 NOTE — H&P (Signed)
[facility]  Patient:    [name], [name]                     MRN: [id number] Adm. Date:  [id number] Attending:  Thyra Breed CC:         Titus Dubin. [name], M.D. [facility]   History and Physical  FOLLOWUP EVALUATION:  The patient comes in for followup evaluation of her chronic low back pain in the basis of lumbar spondylosis with underlying spinal stenosis.  Since her previous evaluation, the patient presents with improvement in her pain overall.  She notes that her pain is 2/10 through most of the day, till late afternoon.  She is taking methadone 5 mg one-half tablet twice a day; she seems to be tolerating this much better.  She has had one fall since her last visit, which her daughter describes as a fairly significant impact; she did have a contusion over the right side of her face. She saw Dr. [name] L. Rendall III and had an injection into her knee and felt tremendously better after this.  EXAMINATION  VITAL SIGNS:  Blood pressure 113/46, heart rate is 74, respiratory rate is 18, O2 saturation is 92%, pain level is 2/10 and temperature is 97.8.  NEUROLOGIC:  Her straight leg raise signs were negative today.  She has no change in her sensory exam.  Her gait is intact.  IMPRESSION 1. Low back pain on the basis of lumbar spondylosis with underlying lumbar    spinal stenosis. 2. Other medical problems per Dr. Titus Dubin. Hopper and    Dr. Aundra Dubin.  DISPOSITION 1. Increase methadone to 5 mg 1/2 tablet q.8h., #45 with no refill. 2. Carry a cane with her even if she is not going to use it to try and avoid    falls. 3. Follow up with me in four weeks. DD:  07/24/00 TD:  07/24/00 Job: [id number] NF/[id number]

## 2011-01-25 NOTE — Discharge Summary (Signed)
NAMECOLLETTE, PESCADOR                        ACCOUNT NO.:  [id number]   MEDICAL RECORD NO.:  [id number]                   PATIENT TYPE:  INP   LOCATION:  4738                                 FACILITY:  [facility]   PHYSICIAN:  Salvadore Farber, M.D.             DATE OF BIRTH:  October 25, 1915   DATE OF ADMISSION:  12/13/2002  DATE OF DISCHARGE:  12/15/2002                           DISCHARGE SUMMARY - REFERRING   PROCEDURES:  None.   HOSPITAL COURSE:  The patient is a 77 year old female who had chest pain.  She had been in the hospital from March 19 to December 07, 2002 for a large  anterior wall MI thought secondary to spasm of her LAD.  She had  nonobstructive coronary artery disease by catheterization and an EF of 25-  30%.  She also had 2-3+ MR.  She had been placed on Capoten and Toprol-XL  recently but the dosages had been decreased secondary to low blood pressure.  The patient's daughter reported that the patient was confused, weak, and  falling at home, also pale.  She had chest pain that radiated to her back  and also had some problems with confusion.  She was admitted for further  evaluation and treatment.   Her medications were adjusted to maximize her blood pressure and heart rate.  This included discontinuing her beta blocker and discontinuing the Imdur as  well as the Capoten.  She was continued on Demodex at a lower dose.  She was  evaluated for renal insufficiency because she was on long-term prednisone  for polymyalgia rheumatica.  An internal medicine consult was called to help  [place] manage these issues.   It was felt that her mental status changes were likely multifactorial -  partly secondary to narcotic use, as well as a UTI, as well as metabolic  derangements, as well as hypoxemia.  CVA was considered unlikely.  She was  treated for her UTI and once her medications were adjusted her blood  pressure improved.  Also, her pain control medications were decreased.  The  patient's daughter was insisting that the mother get pain medications but  sometimes these were held because the mother at that time would be hard to  arouse.  When the patient was arousable she denied nausea or pain so these  medications were held unless the patient actually complained of symptoms.   By December 15, 2002 treatment had been started for a urinary tract infection  and her blood pressure had improved.  She was asymptomatic as long as her  systolic blood pressure was greater than 88.  Even after the Toprol-XL was  discontinued her heart rate was in the 40s at times during sleep but it was  felt that she was not symptomatic with this and pacemaker was not indicated.   With improvement of her mental status and her UTI under treatment she was  considered stable for discharge on December 15, 2002.   LABORATORY VALUES:  Digoxin level 1.4.  RBC folate 303 with a B12 of 327.  Urinalysis showed moderate leukocyte esterase with 7-10 white blood cells  per high-powered field.  Sodium 141, potassium 4.4, chloride 104, CO2 33,  BUN 41, creatinine 1.2, glucose 102.  Hemoglobin 11.1, hematocrit 32.6,  wbc's 12.9, platelets 304.   Chest x-ray:  Improved bibasilar opacities with minimal heterogenous opacity  persisting at the right base and probably reflects chronic change.   DISCHARGE CONDITION:  Improved.   DISCHARGE DIAGNOSES:  1. Chest tightness, atypical pain with negative enzymes for myocardial     infarction this admission.  2. Status post anterior wall myocardial infarction in March 2004 secondary     to spasm of the left anterior descending coronary artery.  3. Nonobstructive coronary artery disease with an ejection fraction of 25-     30% and 3-4+ mitral regurgitation at catheterization.  4. History of aneurysm.  5. Right upper lobe left thyroid nodule.  6. Adrenal insufficiency.  7. Polymyalgia rheumatic.  8. Status post mastectomy for breast cancer, bilateral knee replacement,      cholecystectomy, detached retina surgery, three cesarean sections, and     appendectomy.  9. Asthma and depression.   DISCHARGE INSTRUCTIONS:  1. Her activity level is to be as tolerated.  2. She is to stick to a low fat and salt diet.  3. She is to get a BMET at her office visit.  4. She is to follow up with Dr. Vear Clock and call for an appointment.  5. She is to keep her appointment with Dr. Tenny Craw.   DISCHARGE MEDICATIONS:  1. _______ 5 mg daily.  2. Pravachol 40 mg daily.  3. Prozac 10 mg daily.  4. Prednisone 5 mg daily.  5. Desyrel 200 mg q.h.s.  6. Klor-Con every-other day.  7. Methadone 5 mg b.i.d.  8. Accolate 20 mg b.i.d.  9. Pulmicort and Serevent b.i.d.  10.      Hydrocodone and/or Ativan as needed.   She is to stop taking Imdur, digoxin, Toprol-XL, Claritin, and Coumadin.     Lavella Hammock, P.A. [facility]                  Salvadore Farber, M.D.    RG/MEDQ  D:  01/13/2003  T:  01/14/2003  Job:  [id number]

## 2011-01-25 NOTE — H&P (Signed)
[facility]  Patient:    [name], [name]                     MRN: [id number] Adm. Date:  [id number] Attending:  Thyra Breed CC:         Titus Dubin. [name], M.D. [facility]   History and Physical  FOLLOW-UP EVALUATION  HISTORY OF PRESENT ILLNESS:  [name] comes in early for follow-up in that she has developed increased lower back discomfort which is new in description from her previous back complaints. She does not recall any trauma or event which could have caused the discomfort, but approximately ten days ago she started to develop sacral discomfort, predominantly off to the left side. It is made worse by weight bearing and has not improved on increased doses of methadone for the past three days. She is up to 5 mg q.6h. She denies any new numbness or tingling, bowel or bladder incontinence or weakness. She has had an event about seven days ago where she felt like her right hip gave way. She has been applying heat which has helped to a mild extent but the pain is much more severe than it has been in the past.  She has recently lost a close friend who passed away about two weeks ago.  Her other medical problems include polymyalgia rheumatica, asthma, osteoporosis, history of breast cancer and osteoarthritis.  CURRENT MEDICATIONS: 1. Serevent. 2. MaxAir. 3. Fluoxetine. 4. Trazodone. 5. Accolate. 6. K-Dur. 7. Demadex. 8. Evista. 9. Methadone.  PHYSICAL EXAMINATION:  VITAL SIGNS:  Blood pressure 149/48, heart rate 75, respiratory rate 20 and oxygen saturation 91%. Pain level 8:10.  NEUROLOGICAL:  Deep tendon reflexes were symmetric in the knees with negative straight leg raise signs. The patient exhibited kyphosis of the dorsal spine with tenderness over the sacrum predominantly over the left SI joint region. It was quite tender to light palpation. The patient is in obvious discomfort.  IMPRESSION: 1. Sacral pain predominantly off to the left  side. Differential would    include the possibility of a parasacral fracture versus sacroiliac joint    pathology versus muscular spasms versus possible intrapelvic pathology    which I feel is unlikely. 2. Low back pain on the basis of lumbar spondylosis and spinal stenosis. 3. Cervical spondylosis. 4. Knee pain on the basis of osteoarthritis. 5. Other medical problems per Dr. [name].  DISPOSITION: 1. Continue on the methadone at 5 mg one p.o. q.6h., #120 with    no refill. 2. Add in Percocet 5/325 mg 1-2 p.o. q.4-6h. p.r.n.,#100. 3. Follow-up with me on Monday which is four days away. If she    is not improved, we will go ahead with x-rays of her sacral    area. The patient is willing to see how the medicines work    before proceeding with x-rays at this point and I advised her    that the x-rays may not show anything even if there was a    parasacral fracture. 4. She is to continue on her other medications. 5. She is also encouraged to apply moist heat to the lower back and    sacral areas. DD:  12/11/00 TD:  12/11/00 Job: [id number]

## 2011-01-25 NOTE — Discharge Summary (Signed)
[name], [name]                        ACCOUNT NO.:  [id number]   MEDICAL RECORD NO.:  [id number]                   PATIENT TYPE:  INP   LOCATION:  2019                                 FACILITY:  [facility]   PHYSICIAN:  Doylene Canning. Ladona Ridgel, M.D. [facility]           DATE OF BIRTH:  04-28-1916   DATE OF ADMISSION:  11/26/2002  DATE OF DISCHARGE:  12/07/2002                                 DISCHARGE SUMMARY   PRIMARY DIAGNOSIS:  Inferior wall myocardial infarction.   SECONDARY DIAGNOSES:  1. Chronic pain.  2. Degenerative joint disease.  3. Chronic obstructive pulmonary disease.  4. Depression.  5. Polymyalgia rheumatica.  6. Chronic steroids.  7. Diverticulosis.  8. Partial gastrectomy.   HISTORY OF PRESENT ILLNESS:  This is an 77 year old female who was admitted  with irretractable atypical chest pain, nausea, and vomiting.  The patient  was recently discharged from [facility] on October 29, 2002.  Post discharge, the patient continued to have migratory left thoracic pain.  More than not, it starts posteriorly and would migrate around the thoracic  anterior chest.  Worse with movement, seemingly may be worse in the morning,  described as sharp with some burning.  She can identify no trigger such as  eating, drinking, position change, or bowel function.  Pain will last hours.  The patient was nauseous and vomiting as she entered Dr. Frederik Pear office.  Prior to that, she was not having any GI symptoms.  The patient has chronic  pain syndrome, seen by Dr. Thyra Breed.  The patient was then admitted to  [facility] with atypical chest pain.  She had a positive D-dimer,  1.27; positive troponin, 2.11; CK 73 with an MB of 88.  The patient was  admitted and placed on a heparin drip.  She was noted to be in atrial  fibrillation with a rapid ventricular response.  Cardiac catheterization was  performed which showed an EF of 25%-30%.  She appeared to have had a large  anterior wall MI.  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  The patient was to be treated with nitrate  and aspirin for 48 hours.  The patient was placed on Coumadin, as well as  heparin, for anticoagulation secondary to her atrial fibrillation.  She was  also placed on an ACE inhibitor as her blood pressure tolerates.  The  patient converted to a normal sinus rhythm and remained on her medication  for her chronic pain.  The patient was started on amiodarone for her atrial  fibrillation.  The patient was also noted to have a thyroid nodule that  should be worked up as an outpatient.  The patient continued with some  nausea and vomiting.  Amiodarone was eventually discontinued and the patient  had a consult with GI services and she was also placed on Protonix.  The  patient had had an NG tube placed for the nausea and vomiting.  The patient  had a consult placed with internal medicine for possible renal  insufficiency.  The patient was to be placed on stress thyroid dosing.  It  was thought that performing an ACTH stim test while already in the midst of  presumed adrenal insufficiency was unlikely to provide definite evidence of  inadequate response, given probable mineral corticoid detection with  decreased blood pressure.  The patient was to be treated with stress dose  hydrocortisone and forgo ACTH stim testing.  The patient would be monitored  clinically.  The patient underwent ultrasound of the abdomen films with  amylase/lipase.  NG tube was eventually discontinued.  The patient's nausea  and vomiting gradually improved.  The patient underwent an EGD which showed  gastritis and a hiatal hernia.  The patient was taken off her Wellbutrin and  placed back on her Prozac.  She was to continue with the AV nodal blocking  drug.  She continued to remain in sinus rhythm and was eventually discharged  to home on March 29.   LABORATORY DATA:  CBC  day of discharge:  WBC was 7.6, H&H 12.5 and 35.4 with  platelets 278.  INR was 1.9.  Sodium was 133, potassium was 3.4, chloride  98, CO2 28, BUN 23, creatinine 1.6, glucose 130.  The patient's potassium  was repleted.  The patient's peak troponin was 0.86.  BNP was 1010.  Upon  admission, it was 1510.  cortisol level was 31.9.  Total iron binding  capacity was 193.  Iron was 51.  Percent sat was 26.  TSH was 3.919 on March  23 and 1.734 on March 25.   CAT scan of the abdomen shows no evidence of obstruction.  There appears to  be probably diverticula associated with the colon, particularly in the  region of the sigmoid and left colon.  Chronic obstructive lung changes with  mild passive hyperemia.  No acute infiltrate or consolidation.  Mild  blunting of the costophrenic angle.  Ultrasound of the abdomen showed poor  visualization of the pancreatic tail and nonvisualized distal abdominal  aorta, status post cholecystectomy, no acute abnormality.  CT of the chest  shows no evidence of pulmonary emboli, a 1-cm focal dilatation of the distal  left lower lobe laterally, lateral segment pulmonary artery unchanged but  query aneurysm.  Stable partial collapse of the right middle lobe with mild  bibasilar atelectasis/scarring.  Mild fullness of the intrahepatic biliary  system and common bile duct which measures up to 1.7 cm.  No definite cause.  Small hiatal hernia.  Enlarging 2-cm left thyroid nodule.   DISCHARGE MEDICATIONS:  1. Accolate 20 mg b.i.d.  2. Prednisone 5 mg daily.  3. Trazodone 200 mg nightly.  4. Pulmicort 2 sprays b.i.d.  5. Serevent 2 sprays b.i.d.  6. K-Dur 20 mEq daily.  7. Methadone 5 mg three times a day.  8. Pravachol 40 nightly.  9. Imdur 30 daily.  10.      Digoxin 0.125 daily.  11.      Toprol XL 25 daily.  12.      Prozac 10 daily.  13.      Demadex 20 daily.  14.      Capoten 12.5 three times a day.  15.      Coumadin 2 mg nightly. 16.      Protonix 40  daily.   PAIN MANAGEMENT:  Pain management was per the pain center.   DIET:  Low-fat/low-cholesterol/low-salt diet.   FOLLOW UP:  She was scheduled to see the [facility] on  Thursday, April 1 at 12:15.  She was also to get a BMET drawn on April 5,  Monday, to check her potassium.  The patient was will also follow with the  physician assistant at [facility] April 13 at 11 a.m., Dr. [name] as previously  scheduled, and Dr. Tenny Craw May 7 at 12:30 p.m.    Chinita Pester, C.R.N.P. [facility]                 Doylene Canning. Ladona Ridgel, M.D. [facility]   DS/MEDQ  D:  12/07/2002  T:  12/08/2002  Job:  [id number]   cc:   Titus Dubin. [name], M.D. [facility]

## 2011-01-25 NOTE — H&P (Signed)
[name], [name]                        ACCOUNT NO.:  [id number]   MEDICAL RECORD NO.:  [id number]                   PATIENT TYPE:  INP   LOCATION:  NA                                   FACILITY:  [facility]   PHYSICIAN:  [name] L. Rendall, M.D.               DATE OF BIRTH:  12-30-1915   DATE OF ADMISSION:  11/14/2003  DATE OF DISCHARGE:                                HISTORY & PHYSICAL   CHIEF COMPLAINT:  Left shoulder pain for the last year.   HISTORY OF PRESENT ILLNESS:  This 77 year old white female patient presented  to Dr. [name] L. Rendall with a history of bilateral knee replacements and  several other surgeries by him in the past.  She has had a 1-year history of  sudden onset but progressive left shoulder pain.  She does report a fall in  the past that seemed to start the pain and then she subsequently had a left  shoulder arthroscopy with the debridement of a large irreparable rotator  cuff tear by Dr. Priscille Kluver on June 20, 2003.  Since the surgery, she has  had no other injury but the pain is getting worse.   At this point, the pain is a constant aching sensation which can become  sharp and stabbing at times.  It seems to be located over the deltoid and  neck region with radiation all the way down her arm into her wrist.  The  pain increases with any range of motion of the shoulder or if she attempts  to carry something in that left arm and then decreases with heat and then  the use of either Vicodin or Dilaudid.  She is a chronic pain patient and is  treated by Dr. Loraine Leriche L. Vear Clock and she uses either the Vicodin or the  Dilaudid, depending upon how severe the pain is.  The shoulder does bother  her at night and she is unable to lie on that left side.  She complains of  the shoulder feeling like it slips out of place and pops at times.  No  grinding, locking or giving way.  She has received a cortisone injection in  that shoulder in the past really with no relief.   ALLERGIES:  1. PENICILLIN causes anaphylaxis.  2. MORPHINE causes anaphylaxis.  3. OXYCONTIN causes confusion.  4. AMINOPHYLLIN causes nausea and vomiting.   CURRENT MEDICATIONS:  1. Pulmicort 200 mcg inhaler 2 puffs inhaled q.a.m.  2. Prozac 20 mg 1 tablet p.o. q.a.m.  3. Trazodone 150 mg 1 tablet p.o. nightly.  4. Accolate 20 mg 1 tablet p.o. b.i.d.  5. K-Dur 20 mEq half a tablet p.o. q.a.m.  6. Demadex 20 mg half a tablet p.o. q.a.m.  7. Serevent 50 mcg inhaler 2 puffs inhaled q.a.m.  8. Methadone 5 mg 1 tablet p.o. q.6 h.  9. Neurontin 100 mg 2 tablets p.o. q.8 h.  10.  Prednisone 5 mg 1 tablet p.o. every other day.  11.      Lortab 10/500 mg 1 tablet p.o. q.4 h. p.r.n. for pain.  12.      Pravachol 80 mg 1 tablet p.o. q.a.m.  13.      NitroQuick 0.4 mg sublingually p.r.n. chest pain.  14.      B vitamin complex 1 tablet p.o. q.a.m.  15.      Miacalcin nasal spray 1 squirt in alternating nares q.a.m.  16.      Aspirin 81 mg 1 tablet p.o. q.a.m.  17.      Dilaudid 2 mg 2 tablets p.o. q.4 h. p.r.n. for pain.  18.      MiraLax 17 g p.o. q.a.m. p.r.n. constipation.   PAST MEDICAL HISTORY:  1. Asthma, which is triggered by perfume and strong scents.  She has     required hospitalization in the past but no ventilation.  Her last asthma     attack was approximately 2000.  2. Hypercholesterolemia, treated with Pravachol.  3. History of myocardial infarction in March 2004 due to coronary vasospasm.     Ejection fraction at that time was 25% to 30% with 2 to 3+ mitral     regurgitation.  Ejection fraction has since recovered.  Last     echocardiogram in June revealed normal ejection fraction and only 1%     mitral regurgitation.  4. Degenerative disk disease of lumbar spine with chronic pain, treated by     Dr. Thyra Breed.   She denies any history of diabetes mellitus, hypertension, thyroid disease,  hiatal hernia, reflux, peptic ulcer disease or any other chronic medical   condition other than noted previously.   PREVIOUS SURGICAL HISTORY:  1. Cesarean sections x3.  2. Gastric surgery.  3. Hysterectomy.  4. Cholecystectomy.  5. Left total knee arthroplasty by Dr. [name] L. Rendall, December 1997.  6. Bilateral mastectomies with lymph node dissection in the left arm, no BPs     or sticks on the left arm.  7. Amputation of 2 toes.  8. Removal of bilateral breast implants.  9. ORIF, right ankle, due to fracture.  10.      Left eye retinal detachment.  11.      Appendectomy.  12.      Carpal tunnel release, right wrist.  13.      Right total knee arthroplasty by Dr. [name] L. Rendall, November of     2002.   She denies any complications from the above-mentioned procedures.   SOCIAL HISTORY:  She denies any history of cigarette smoking, alcohol use or  drug use.  She is a widow with 5 grown children.  She lives in a 1-story  house with her daughter with 5 steps into the main entrance.  She is a  retired [employer] trader.  Her medical doctor is Dr. Titus Dubin. [name]; his phone  number is [phone].  Her cardiologist is Dr. Pricilla Riffle; her phone number  is [phone].  Her chronic pain doctor is Dr. Thyra Breed and his phone  number is [phone].   FAMILY HISTORY:  Mother is deceased at age 26 due to necrotic bowel.  Father  died just with old age.  She has 2 brothers who passed away, 1 due to  emphysema and cardiac disease and 1 at age 68 with old age.  She has 2  sisters who passed away with Alzheimer's disease, heart failure and history  of colon  cancer.  She has 1 daughter with a history of breast cancer and 1  son with a history of a stroke.   REVIEW OF SYSTEMS:  She does wear glasses for reading.  She has bilateral  hearing aids.  She did have a syncopal episode in January of 2005 and was  hospitalized for 2 days with complete workup at that time.  She has been  cleared for surgery by Dr. Tenny Craw.  She does complain of some arthritic-type neck pain.  Last  episode of pneumonia was in November of 2004.  She does  have chronic low back pain treated by Dr. Thyra Breed.  All other systems  are negative and noncontributory.   PHYSICAL EXAM:  GENERAL:  Well-developed, well-nourished white female who  walks without a limp.  Mood and affect are appropriate.  She talks easily  with the examiner.  Keeps that left arm close to her side.  Height 5 feet 0  inches, weight 140 pounds, BMI is 26.5.  VITAL SIGNS:  Temperature 96.7 degrees Fahrenheit, pulse 60, respirations 12  and BP 100/40.  HEENT:  Normocephalic, atraumatic, without frontal or maxillary sinus  tenderness to palpation.  Conjunctivae pink, sclerae anicteric.  PERRLA;  left eye pupil, however, does not react to light.  EOMs are intact.  No  visible external ear deformities.  Hearing grossly intact.  Tympanic  membranes pearly gray bilaterally with good light reflex.  Nose and nasal  septum midline.  Nasal mucosa pink and moist without exudates or polyps  noted.  Buccal mucosa pink and moist.  Good dentition.  Pharynx without  erythema or exudates.  Tongue and uvula midline.  Tongue without  fasciculations and uvula rises equally with phonation.  NECK:  No visible masses or lesions noted.  Trachea midline.  No palpable  lymphadenopathy nor thyromegaly.  Carotids +2 bilaterally without bruits.  Full range of motion and nontender to palpation along the cervical spine.  CARDIOVASCULAR:  Heart rate and rhythm regular.  S1 and S2 present without  rubs, clicks or murmurs noted.  RESPIRATORY:  Respirations even and unlabored.  Breath sounds clear to  auscultation bilaterally without rales or wheezes noted.  ABDOMEN:  Rounded abdominal contour with a well-healed midline incision.  Bowel sounds present x4 quadrants.  Soft and nontender to palpation, without  hepatosplenomegaly not CVA tenderness.  Femoral pulses +2 bilaterally.  BACK:  She does have generalized discomfort with palpation of the  lumbar  spine.  BREASTS:  She does have well-healed mastectomy scars, bilateral chest.  No  active drainage, erythema or ecchymosis.  GU/RECTAL/PELVIC:  These exams deferred at this time.  MUSCULOSKELETAL:  She has full range of motion of her elbows and wrists and  fingers bilaterally.  Radial pulses are +2.  She does have pain with  palpation about the left wrist in the area of the first dorsal compartment.  She does have a positive Finkelstein's test on that side.  Right shoulder:  She has no pain with palpation about the shoulder at all.  She does have  minimal crepitus with range of motion of that shoulder but range of motion  is completely full without pain.  Strength of the right shoulder is 5/5.  Left shoulder:  She does have pain with palpation over the [facility] joint in the  area of the bicipital tendon.  There is a moderate amount of crepitus with  range of motion and she guards range of motion quite severely.  She will only  forward flex her shoulder to about 45 degrees, abduction only of about  30 degrees and she resists any internal or external rotation.  She complains  of pain with all range of motion of that shoulder.  She has full range of  motion of her hips, ankles and toes.  DP and PT pulses are +2.  No pitting  edema of the lower extremities but her lower extremities do appear to be a  little thicker than normal.  The right knee has a well-healed midline  incision over the knee with full extension and flexion to 115 degrees.  No  pain or crepitus with range of motion and no pain with palpitation about the  joint.  Stable to varus and valgus stress.  Left knee has a well-healed  midline incision also, 0 to 125 degrees of range of motion.  Again, no pain  or crepitus with range of motion or palpation.  Stable to varus and valgus  stress.  Negative Homans and no pain with palpation of the calves  bilaterally.  NEUROLOGIC:  Alert and oriented x3.  Cranial nerves II-XII are  grossly  intact.  Strength 5/5, bilateral upper and lower extremities, except the  left shoulder not tested due to pain.  Sensation intact to light touch.  Deep tendon reflexes 2+, bilateral upper and lower extremities.   RADIOLOGIC FINDINGS:  X-rays brought with the patient in September of 2004  of the left shoulder show narrowing of the space between the humeral head  and the acromion with impingement changes of the greater tuberosity.  When  she had the arthroscopy done of the shoulder, she had a irreparable rotator  cuff repair.   IMPRESSION:  1. Osteoarthritis, left shoulder, with chronic pain.  2. History of osteoarthritis, bilateral knees, status post knee     replacements.  3. Asthma.  4. Coronary artery disease with history of myocardial infarction, March     2004.  5. Degenerative disk disease, lumbar spine, with chronic pain, treated by     Dr. Thyra Breed.  6. Hypercholesterolemia.  7. Blind, left eye, due to retinal detachment.  8. History of syncopal episode, January of 2005, with negative workup.   PLAN:  Ms. Wesche will be admitted to [facility] on November 14, 2003, where she will undergo a left shoulder hemiarthroplasty by Dr. [name] L.  Rendall.  She will undergo all the routine preoperative laboratory tests and  studies prior to this procedure.  Because of her history of breast cancer  with lymph node dissection on the opposite side, we will have her fitted for  a 20-30 compression sleeve for that left arm to be placed postoperatively.  If we have any medical issues while the patient is hospitalized, we will  consult either Dr. [name], Dr. Vear Clock or Dr. Tenny Craw.      Legrand Pitts Duffy, P.A.                      [name] L. Priscille Kluver, M.D.    KED/MEDQ  D:  11/09/2003  T:  11/09/2003  Job:  [id number]

## 2011-01-25 NOTE — Op Note (Signed)
[place]  Patient:    [name], [name]                     MRN: [id number] Proc. Date: 12/31/00 Adm. Date:  [id number] Attending:  Loura Halt Ii                           Operative Report  PREOPERATIVE DIAGNOSIS: 1. Breast cancer. 2. Status post previous breast reconstruction with silicone breast implants. 3. Rupture of right silicone breast implant.  POSTOPERATIVE DIAGNOSIS: 1. Breast cancer. 2. Status post previous breast reconstruction with silicone breast implants. 3. Rupture of right silicone breast implant.  OPERATION PERFORMED:  Bilateral capsulectomy and removal of breast implants.  SURGEON:  Alfredia Ferguson, M.D.  ANESTHESIA:  General laryngeal mask.  INDICATIONS FOR PROCEDURE:  The patient is an 76 year old woman who was status post bilateral mastectomies for breast cancer.  She was reconstructed with silicone breast implants many years ago.  On a CAT scan done on her chest to evaluate her lungs, it was noted that her right breast implant appeared to be ruptured.  The patient desires to have both implants out along with the surrounding capsule.  She does not want any further breast reconstruction. She understands that she will be quite flat and may have irregularity in her skin.  She may also have some unsightly scarring.  In spite of that, she wishes to proceed with the surgery.  DESCRIPTION OF PROCEDURE:  After adequate general laryngeal mask anesthesia had been induced, the patients chest was prepped and draped in sterile fashion.  Attention was directed on the right side.  An 8 cm elliptical excision of the previous mastectomy scar was carried out.  The implant on the right was noted to be subcutaneous.  A subcutaneous dissection was carried out leaving the capsule intact anteriorly.  In the lateral portion of the dissection, it was noted that the patient did have some muscular coverage by what appeared to be the  anterior serratus muscle or perhaps some residual pectoralis muscle.  This was the only portion of the implant which was covered with muscle.  Inferiorly, the capsule was inadvertently opened and silicone came forth.  It was a liquid silicone.  This leakage was controlled with finger pressure.  The entire capsule was dissected off the chest wall. Interestingly there was no pectoralis muscle against the chest wall either. Using electrocautery the entire posterior aspect of the capsule was removed. The capsule and its contained implant was passed off the field.  Hemostasis was accomplished using electrocautery.  The pocket was copiously irrigated with saline irrigation.  A 10 mm Blake drain was placed in the lateral axillary gutter and brought out through a separate stab incision.  After inspecting once again for hemostasis and ensuring that the wound was dry, the wound was then closed.  The incision was closed using interrupted 3-0 Vicryl suture for the dermis.  Steri-Strips were applied to the skin edges. Attention was directed to the left side. An identical procedure was performed. The implant on the left side was completely subcutaneous.  The pectoralis muscle deep to the implant was intact.  The implant and its surrounding capsule was removed without difficulty on the left side.  The capsule was opened and noted that the patient had a textured type implant with no leakage of silicone.  The specimen was passed off the field.  Hemostasis on the  left was carried out using electrocautery.  It should be noted that the breast flaps were extremely thin on the left side.  After achieving hemostasis, the pocket was copiously irrigated with saline irrigation.  A 10 mm Blake drain was placed in the axillary gutter.  Closure was carried out in a similar fashion on the left side as the right.  16 cc of 0.5% Marcaine 1:200,000 was infiltrated in a circumferential fashion around each of the breasts to  try to improve pain control postoperatively.  Dressings were applied and the patient had a circumferential wrap with 6 inch Ace bandage applied.  The patient tolerated the procedure well with an estimated blood loss of approximately 50 to 75 cc.  She was awakened, extubated and transported to the recovery room in satisfactory condition. DD:  12/31/00 TD:  12/31/00 Job: [id number]

## 2011-01-25 NOTE — Discharge Summary (Signed)
NAMETAJANAE, GUILBAULT                        ACCOUNT NO.:  [id number]   MEDICAL RECORD NO.:  [id number]                   PATIENT TYPE:  INP   LOCATION:  5154                                 FACILITY:  [facility]   PHYSICIAN:  Rene Paci, M.D. [facility]          DATE OF BIRTH:  10-23-1915   DATE OF ADMISSION:  01/02/2003  DATE OF DISCHARGE:  01/05/2003                                 DISCHARGE SUMMARY   DISCHARGE DIAGNOSES:  1. Fever.  2. Right-sided pneumonia.  3. Hypoxemia.  4. Nausea.   BRIEF ADMISSION HISTORY:  Ms. Woodford is an 77 year old white female with  multiple admissions in the last several months. She presented with nausea,  vomiting.  She is noted to have a right-sided infiltrate on chest x-ray. She  also has been complaining of cough. No shortness of breath.   PAST MEDICAL HISTORY:  1. Cardiovascular cardiomyopathy with recent cardiac catheterization     revealing mild coronary disease, not felt to be ischemic, but probably     had a recent inferior MI in March 2004 likely secondary to coronary     spasm.  2. A 3+ mitral regurgitation.  3. Paroxysmal atrial fibrillation.  Coumadin was discontinued secondary to     increased falls.  4. Polymyalgia rheumatic.  5. History of diverticular disease.  6. COPD.  7. Degenerative joint disease.  8. History of a thyroid nodule.  9. History of gastritis status post partial gastrectomy.  10.      Depression and anxiety.  11.      Chronic back pain.  12.      Anemia.  13.      Status post cholecystectomy.   HOSPITAL COURSE:  Problem  1:  ID.  The patient presented with fever and  evidence of a right-sided pneumonia. She was started on IV Rocephin and  Zithromax as well as oxygen and nebulizer treatments.  The patient  defervesced.  Her white count was normal.  Blood cultures were negative.  She was changed to oral antibiotics which she has tolerated.  O2 saturations  were 100% on room air.  The patient was felt to be  stable for discharge  home, to complete a full 10-day course of antibiotics.   Whereas, at discharge, urine culture was negative.  Hemoglobin was 11, coags  were normal.  BMET was normal. Blood culture and urine cultures are  negative.   DISCHARGE MEDICATIONS:  These are the same medications that she was  discharged on 12/15/2002:  1. Demodex 5 mg daily.  2. Pravachol 40 mg daily.  3. Prozac 10 mg daily.  4. Prednisone 5 mg daily.  5. Desyrel 20 mg q.h.s.  6. Klor-Con 20 mEq every other day.  7. Methadone 5 mg t.i.d.  8. Accolate 20 mg b.i.d.  9. Pulmicort and Serevent as at home.  10.      Additionally Ceftin 250 mg b.i.d. for 7 days.  11.      Per her last admission 12/15/2002 her Imdur, Digoxin, Toprol,     Capoten and Coumadin have been discontinued.  These were all discontinued     secondary to hypotension and increased falls.   DISCHARGE INSTRUCTIONS:  Follow up with Dr. [name] in 2-3 weeks.     Cornell Barman, P.A. [facility]                  Rene Paci, M.D. [facility]    LC/MEDQ  D:  01/05/2003  T:  01/05/2003  Job:  [id number]   cc:   Titus Dubin. [name], M.D. [facility]   Dr. Ashley Mariner, M.D.

## 2011-01-25 NOTE — Procedures (Signed)
[facility]  Patient:    [name], [name]                     MRN: [id number] Proc. Date: 01/13/01 Adm. Date:  [id number] Disc. Date: [id number] Attending:  Loura Halt Ii CC:         Titus Dubin. [name], M.D. [facility]  Aundra Dubin, M.D.   Procedure Report  PROCEDURE:  Lumbar epidural steroid injection.  DIAGNOSIS:  Lumbar spinal stenosis with lumbar spondylosis.  INTERVAL HISTORY:  The patient has noted that her back pain is improving overall.  But she has noted that the oxycodone is giving her a bit of a temper, and we discussed taking her off this and putting her on hydrocodone which she is very interested in doing.  She notes that per pain is predominantly into the right anterior thigh down to her knee, exacerbated by activities.  She has had her breast surgery and feels much better from this, although she has had an episode which she had rigors which I do not think were related to her medications.  Her MRI from May 2 showed multilevel degenerative disk disease and spondylosis with hypertrophy of the ligamentum flavum.  She has spinal stenosis to a moderate degree at L3-4 and to a mild degree at L2-3.  CURRENT MEDICATIONS: 1. Methadone 5 mg 1 every 5 hours 4 tablets a day. 2. Dilaudid which she does not take for rescue pain. 3. Percocet 5/325.  She is taking up to 3 of these per day. 4. Serevent, Maxair, fluoxetine, trazodone, Accolate, Evista, K-Dur, Demadex,    and prednisone 2.5 mg per day.  PHYSICAL EXAMINATION:  Blood pressure 110/43, heart rate 66, respiratory rate 16, O2 saturations 93%.  Pain level is 5/10.  Straight leg raise signs are negative.  Her back demonstrates some mild scoliosis.  Deep tendon reflexes are symmetric in the lower extremities.  DESCRIPTION OF PROCEDURE:  After informed consent was obtained, the patient was placed in a sitting position and monitored.  Her back was prepped with Betadine x 3.  A skin  wheal was raised at the L3-4 interspace with 1% lidocaine.  A 20 gauge Tuohy needle was introduced in the lumbar epidural space to loss of resistance to preservative-free normal saline.  There was no CSF nor blood.  Medrol 80 mg in 8 mL of preservative-free normal saline was gently injected.  The needle was flushed and removed intact.  Postprocedure condition - stable.  DISCHARGE INSTRUCTIONS: 1. Resume previous diet. 2. Limitations on activities per instruction sheet. 3. Continue on current medications. 4. Follow up with me in one week to consider a repeat injection. DD:  01/13/01 TD:  01/13/01 Job: [id number]

## 2011-01-25 NOTE — Consult Note (Signed)
[facility]  Patient:    [name], [name]                     MRN: [id number] Proc. Date: 08/22/00 Adm. Date:  [id number] Attending:  Thyra Breed CC:         Aundra Dubin, M.D.  Titus Dubin. [name], M.D. [facility]   Consultation Report  FOLLOW-UP EVALUATION  [name] comes in for follow-up evaluation of her lumbar spondylosis with underlying lumbar spinal stenosis.  Since her previous evaluation, she has been relatively stable except for the fact that her pain is pretty significant in the mornings.  She is currently taking her methadone 5 mg 1 tablet in the morning, 1/2 tablet at midday, and 1/2 tablet in the evening.  She does well through the course of the day except occasionally in the afternoons and in the mornings.  She has had minimal side effects from the methadone but does note that sometimes it makes her mildly sleepy.  She is a bit concerned about the fact that she has to have some breast surgery in early January but other than that, her only other complaint is left arm discomfort which is most pronounced at night with associated numbness and tingling out over the lateral aspect of the forearm.  PHYSICAL EXAMINATION:  Blood pressure is 112/46.  Heart rate is 87, respiratory rates 20, O2 saturations 93%.  Pain level is 3/10.  She has good range of motion of her neck with negative Spurling sign.  Straight leg raise signs are negative.  Deep tendon reflexes are 2+ and symmetric in the upper and lower extremities, with the exception of the left triceps which is 1+. She has some left mild triceps weakness which is 4.5-5.  Otherwise her strength is 5/5 throughout.  Pulses are 2+ and symmetric in the upper and lower extremities.  IMPRESSION: 1. Low-back pain on the basis of lumbar spondylosis and underlying lumbar    stenosis. 2. Left cervical radiculopathy which probably reflects some cervical    spondylosis. 3. Other medical problems per Dr.  Marga Melnick and Dr. Stacey Drain.  DISPOSITION: 1. Increase the methadone to 5 mg 1 p.o. q.8h. #90 with no refill. 2. Continue with cane, as needed. 3. Follow up with me in eight weeks. 4. She is to swing by for another prescription of her methadone when needed. DD:  08/22/00 TD:  08/22/00 Job: [id number]

## 2011-01-25 NOTE — H&P (Signed)
[facility]  Patient:    [name], [name]                     MRN: [id number] Adm. Date:  [id number] Attending:  Thyra Breed CC:         Titus Dubin. [name], M.D. [facility]   History and Physical  FOLLOWUP EVALUATION:  The patient comes in for followup evaluation of her chronic low back pain.  Since her last evaluation, she feels better but she is requiring around-the-clock Percocet and methadone.  She is scheduled for surgery tomorrow and I advised her that we would need to fashion an analgesic regime for her postop pain in accordance with the current requirements.  She complains of pain that radiates from her lumbosacral region out along the lateral aspect of her right hip to the knee.  This is associated with intrinsic knee discomfort in addition.  She rates the pain to equally bad at 5/10, but they are much improved over previously.  I discussed the possibility of proceeding with an MRI versus going ahead and scheduling her back for a series of lumbar epidural steroid injections versus injection into her right knee.  She wishes to go ahead and get an MRI.  PHYSICAL EXAMINATION:  VITAL SIGNS:  Blood pressure 106/39.  Heart rate 65.  Respiratory rate is 20. O2 saturation is 95%.  Pain level is 5/10.  MUSCULOSKELETAL:  Exam of her back revealed tenderness over the right parasacral as well as left parasacral regions with deep tendon reflexes symmetric at the ankles, 2+ at the left knee, 1+ at the right knee.  She has some crepitus on range of motion of her right knee.  IMPRESSION: 1. Chronic low back pain with radiation now more out into the right lower    extremity; rule out possible underlying exacerbation of lumbar spondylosis    versus herniated disk superimposed on her spinal stenosis versus intrinsic    hip and knee discomfort. 2. Left hip pain which seems to be improved from previously. 3. Other medical problems per Dr. Titus Dubin.  [facility].  ADDITIONAL HISTORY:  The patient complains of swelling of her left breast but she is having surgery to remove implants tomorrow.  DISPOSITION: 1. Continue on methadone 5 mg one p.o. q.4h. x 4 during the day. 2. Percocet 5/325 mg one to two p.o. q.4-6h. hours during the day, not to    exceed eight tablets per day. 3. Dilaudid 4 mg 1/2 to 1 tablet p.o. q.4h. p.r.n. breakthrough pain postop,    #30 with no refill. 4. Schedule for MRI of the lumbar spine. 5. Follow up with me after the MRI for either injection into her knee or a    series of lumbar epidural steroid injections. DD:  12/30/00 TD:  12/31/00 Job: [id number]

## 2011-01-25 NOTE — Discharge Summary (Signed)
[name], [name]              ACCOUNT NO.:  [id number]   MEDICAL RECORD NO.:  [id number]          PATIENT TYPE:  INP   LOCATION:  6735                         FACILITY:  [facility]   PHYSICIAN:  Thomos Lemons, D.O. [facility]   DATE OF BIRTH:  Dec 25, 1915   DATE OF ADMISSION:  01/31/2005  DATE OF DISCHARGE:  02/02/2005                                 DISCHARGE SUMMARY   DISCHARGE DIAGNOSES:  1.  Atypical chest pain, presumed secondary to left upper lobe pneumonia.  2.  History of anemia.  3.  Enlarging left thyroid nodule.  4.  History of depression.  5.  Hyperlipidemia,.  6.  Osteoarthritis.   DISCHARGE MEDICATIONS:  1.  Avelox 400 mg once daily x10 days.  2.  Demadex 20 mg once a day.  3.  Potassium chloride 20 mEq once a day.  4.  Aspirin 81 mg once a day.  5.  Protonix 40 mg once a day.  6.  Desyrel 100 mg at bedtime.  7.  Prozac 20 mg once daily.  8.  Serevent 1 puff b.i.d.  9.  Calcitonin nasal spray, 1 spray daily in each nostril.  The patient to      alternate nostrils.  10. Lyrica 50 mg q.8h.  11. Pravachol 80 mg at bedtime.  12. Singular 10 mg at bedtime.  13. Resume methadone as before.   FOLLOWUP INSTRUCTIONS:  She is to follow up with Dr. Ashley Akin in one week.  She is to call his office for an appointment.  The patient needs fine needle  aspiration of enlarging left thyroid nodule.   HOSPITAL COURSE:  The patient is an 77 year old white female, admitted on  Jan 31, 2005 by Dr. Romero Belling, secondary to atypical chest pain.  The  patient has a history of coronary artery disease with a catheterization in  2004.  The patient had three negative cardiac enzymes, but due to an  elevated D-dimer, underwent a CT scan of her chest with PE protocol, which  revealed a left upper lobe pneumonia and enlarging left thyroid nodule.  An  ultrasound with FNA was recommended.  The patient on the day of discharge,  was hemodynamically stable, was 96% on room air and was afebrile. The  patient was able to ambulate without significant difficulty.  The patient's  daughter is at home and will be able to look after the patient.   HOSPITAL COURSE:  1.  Atypical chest pain, resolved, presumed secondary to pneumonia.  The      patient is to finish a 10 day course of Avelox and follow up with Dr.      [name].  2.  Hyperlipidemia.  3.  Depression.  4.  Osteoarthritis.  These problems remain stable during her      hospitalization.  5.  History of coronary artery disease with status post catheterization in      2004.  The patient had negative cardiac enzymes x3.   The patient's prognosis is fair.      RY/MEDQ  D:  02/02/2005  T:  02/02/2005  Job:  [id number]  cc:   Titus Dubin. [name], M.D. [facility]

## 2011-01-25 NOTE — H&P (Signed)
[facility]  Patient:    [name], [name]                     MRN: [id number] Adm. Date:  [id number] Attending:  Thyra Breed CC:         Titus Dubin. [name], M.D. [facility]   History and Physical  FOLLOW-UP EVALUATION:  Toree comes in for follow-up evaluation of her left parasacral discomfort and right hip pain. Since her last evaluation, her pain has come down somewhat to a level of 5/10. It was up to a level of about 8/10. She continued to have discomfort which limits her ability to walk around. She tried to shop for some plants and was not able to do this on Friday. She denies any significantly increased numbness or tingling but does note that walking up and down steps tends to exacerbate her discomfort. We reviewed her opiates and the fact that methadone is a long-acting opiate and Percocet which she is taking up to eight tablets per day is a short-acting opiate. She should look upon the short-acting opiates as a way to bridge over until the long-acting opiates kick in to a significant extent.  CURRENT MEDICATIONS:  Unchanged from last week, except for the Percocet addition.  PHYSICAL EXAMINATION:  VITAL SIGNS:  Blood pressure is 109/41, heart rate is 63, respiratory rate is 18, O2 saturation is 96%, pain level is 5/10.  NEUROLOGICAL:  Her deep tendon reflexes were symmetric in the lower extremities with negative straight leg raise signs. She has good range of motion of her hips. She is tender over the left S1 joint region and left piriformis region.  IMPRESSION: 1. Left hip pain and parasacral discomfort. 2. Underlying lumbar spondylosis with spinal stenosis. 3. Other medical problems per Titus Dubin. [name], M.D.  DISPOSITION: 1. Increase methadone to 5 mg one q.4h. 2. Continue with the Percocet for breakthrough pain. She was given a    prescription for 100 tablets. 3. Follow up with me in 2 weeks. 4. She was encouraged to call if her pain does  not continue to improve. DD:  12/15/00 TD:  12/15/00 Job: [id number]

## 2011-01-25 NOTE — Procedures (Signed)
[facility]  Patient:    [name], [name]                     MRN: [id number] Proc. Date: 02/06/01 Adm. Date:  [id number] Disc. Date: [id number] Attending:  Loura Halt Ii CC:         Helene Kelp, M.D.  Titus Dubin. [name], M.D. [facility]   Procedure Report  PROCEDURE:  Lumbar epidural steroid injection.  ANESTHESIOLOGIST:  Thyra Breed, M.D.  INDICATIONS:  [name] comes in for follow-up evaluation of her lumbar spinal stenosis on the basis of lumbar spondylosis.  Since her second injection, she has noted marked improvement in her pain level.  She has had minimal side effects.  She states that her medications seem to be working much better.  PHYSICAL EXAMINATION: VITAL SIGNS:  Blood pressure 130/41, heart rate 66, respiratory rate 66, O2 saturation 95%.  BACK:  She shows good healing of her previous injection site.  DESCRIPTION OF PROCEDURE:  After informed consent was obtained, the patient was placed in the sitting position and monitored.  Her back was prepped with Betadine x 3.  A skin wheal was placed at the L4-L5 interspace with 1% lidocaine.  A 20 gauge Tuohy needle was introduced into the lumbar epidural space to a loss of resistance to preservative free saline.  There was no CSF or blood.  Then 80 mg of Medrol and 8 ml of preservative free normal saline was gently injected.  The knee was flushed with preservative free normal saline and removed intact.  POSTPROCEDURE CONDITION:  Stable.  DISCHARGE INSTRUCTIONS: 1. Resume previous diet. 2. Limitations of activities per instruction. 3. Continue on current medications. 4. Follow up with me in about four weeks. 5. She is to let me know if she needs prescriptions between now and then.  DISCHARGE INSTRUCTIONS: 1. The patient is to resume his previous medications. 2. Activities limited per instruction sheet. 3. Follow up DD:  02/06/01 TD:  02/06/01 Job: [id number]

## 2011-01-25 NOTE — Discharge Summary (Signed)
[facility]  Patient:    [name], [name]                     MRN: [id number] Adm. Date:  [id number] Disc. Date: [id number] Attending:  Carrie Mew CC:         Alfredia Ferguson, M.D.  Thyra Breed, M.D., [facility]   Discharge Summary  ADMISSION DIAGNOSES: 1. Atypical chest pain. 2. Chronic obstructive pulmonary disease with asthmatic component. 3. Chronic pain syndrome, questionably polymyalgia rheumatica type. 4. Fatigue. 5. Anxiety.  DISCHARGE DIAGNOSES: 1. Atypical chest pain. 2. Chronic obstructive pulmonary disease with asthmatic component. 3. Chronic pain syndrome, questionably polymyalgia rheumatica type. 4. Fatigue. 5. Anxiety. 6. Shortness of breath due to asthma. 7. Lipedema. 8. Hypokalemia, resolved. 9. Possible rupture of right breast implant.  BRIEF HISTORY:  Ms. Goulart is an 77 year old white female with COPD, esophageal reflux, history of anxiety, and history of peptic ulcer disease, who presents with a two-day history of left-sided chest pain.  It would radiate to the back.  It is nonexertional and not associated with diaphoresis or nausea.  She describes this as constant most of the day prior to admission. She was admitted and ruled out for a myocardial infarction in October 2000.  She has seen Dr. Kellie Simmering for polymyalgia rheumatica and is followed in the clinic pain clinic by Dr. Thyra Breed.  HOSPITAL COURSE:  She was admitted and serial cardiac enzymes collected.  The total CPK and MBs were within normal limits x 3 with normal troponin x 2. Hematocrit was 33.9 with normal differential.  Sed rate was 11.  Her BMET was normal except for a total protein minimally reduced at 5.9 and a potassium of 3.4.  Lipid profile was amazingly good with a cholesterol of 147, triglycerides of 76, HDL 43, and an LDL of 89.  Chest x-ray revealed COPD with no active disease.  Because of persistent chest pain, a  spiral CT was performed which revealed no evidence of pulmonary embolus.  This suggested a possible implant rupture on the right.  MRI was recommended for further evaluation.  Additionally, she complained of edema which clinically appeared to be more lipedema.  There was a question of positive Homans.  Venous Doppler was negative.  A 2-D echocardiogram was performed which showed normal left ventricular function, ejection fraction 55 to 65%.  Aortic valve thickness was mildly increased as was the tricuspid valve.  She complained of dyspnea and was found to have low-grade wheezing.  Her Serevent was increased to 2 puffs every 12 hours and Pulmicort 4 puffs every 12 hours.  She described yellow sputum but chest x-ray and CT revealed no infiltrate.  There was some scarring in the right inferior aspect of the right upper lobe.  No purulent sputum was documented while hospitalization, and she remained afebrile throughout her stay.  She was seen in consultation by Dr. Delia Chimes who recommended MRI with followup in his office once this was completed.  This MRI will require the special equipment at [facility] and cannot be done until July 04, 2000, at 2 p.m.  She was instructed to register by 1:30 p.m.  A copy of that report will be requested for Dr. Benna Dunks.  In the hospital, she complained of her chronic pain.  She stated that OxyContin "caused me to be crazy."  She felt the Duragesic patch was causing shortness of breath.  At her request, she was switched to hydrocodone.  She continued to have some left upper quadrant chest pain rather than substernal pain.  She was placed on Protonix.  If symptoms persist as an outpatient on the Protonix, then endoscopy will be pursued.  DIET:  She was discharged on diet of choice with restriction of sodium because of subjective edema.  DISCHARGE MEDICATIONS: She was switched to Demadex 20 mg daily in place of the Lasix as she felt the Lasix  was ineffective.  She was to increase the Serevent to 2 puffs every 12 hours and Pulmicort 4 puffs every 12 hours until her asthma was all controlled, and then decrease to 1 or 2 puffs of the Pulmicort every 12 hours. Potassium chloride 10 mEq was prescribed daily.  Additionally, she was to use the rescue drug albuterol every 4 hours as needed for wheezing or shortness of breath.  Should she have fever or purulent secretions, antibiotics will be introduced.  She was given a prescription of 50 hydrocodone 10 mg and APAP 325 mg every 4 hours as needed.  There was to be no change in her Accolate b.i.d., E-Vista, Desyrel, or Prozac. The E-Vista was continued as there was no evidence of deep venous thrombosis.  DISCHARGE STATUS:  Improved.  Prognosis is good, although she does have chronic issues which are note expected to improved, particularly the chronic pain syndrome and chronic anxiety and depression which have been multiple decade issues.  ACTIVITY:  As tolerated.  FOLLOWUP:  She will be asked to see me in two to three weeks or sooner should she have fever or purulent secretions. DD:  06/20/00 TD:  06/22/00 Job: [id number]

## 2011-01-25 NOTE — Discharge Summary (Signed)
[name], KUIPERS              ACCOUNT NO.:  [id number]   MEDICAL RECORD NO.:  [id number]          PATIENT TYPE:  INP   LOCATION:  1611                         FACILITY:  [facility]   PHYSICIAN:  Rosalyn Gess. Norins, M.D. LHCDATE OF BIRTH:  05/04/1916   DATE OF ADMISSION:  02/24/2006  DATE OF DISCHARGE:  02/28/2006                                 DISCHARGE SUMMARY   ADMITTING DIAGNOSES:  1.  Cellulitis of distal lower extremity secondary to dog scratch.  2.  Diarrhea   CONSULTATIONS:  None.   PROCEDURES:  1.  X-ray of right tibia-fibula on February 25, 2006, which showed no evidence      of osteomyelitis and soft tissue swelling consistent with cellulitis.  2.  A 12-lead electrocardiogram on June 21, which showed right bundle branch      block, left anterior fascicular block, T-wave inversion in V1.  Do not      have an old electrocardiogram for comparison.   HISTORY OF PRESENT ILLNESS:  The patient is a very delightful, 77 year old  woman apparently was seeming much younger than her stated age who was in her  usual state of health when she was scratched by a dog 5 days prior to  admission.  She developed worsening erythema and tenderness.  She was seen  by her primary care physician, Dr. [name], who started her on doxycycline.  However, her leg continued to swell and developed a necrotic center.  She  had no fevers or chills.  There had been some slight mild drainage from the  site.  She was started on Avelox due to her penicillin allergy which reacted  with significant burning and rash and this was stopped.  The patient is now  subsequently admitted.   Please see H&P for past medical history, family history, social history and  medications.   PHYSICAL EXAMINATION:  VITAL SIGNS:  Examination at admission was  significant for temperature of 98.4, normal blood pressure of 111/59.  EXTREMITIES:  No cyanosis or clubbing.  There was bilateral 2+ chronic lower  extremity edema and a 2 x  1-cm erythematous lesion with necrotic center on  the right shin.   HOSPITAL COURSE:  Problem 1.  CELLULITIS:  The patient was, after failing  the Avelox, tried on clindamycin and azithromycin, but also had an allergic  reaction to this.  The patient did have a positive wound culture for  methicillin-resistant Staphylococcus aureus.  Sensitivities were checked and  there was a positive sensitivity for Septra.  The patient had been given a  dose of vancomycin, but with her sensitivities being back and Septra being  an acceptable antibiotic, this was started on the day prior to discharge.  The patient tolerated this medication well.  She remained afebrile with a T-  max of 99.  With the patient being able to tolerate oral Septra which is  targeted to this methicillin-resistant Staphylococcus aureus organism, she  is felt to be stable and ready for discharge.  She will continue antibiotics  at home.  She will need to keep her legs elevated as much as possible.Marland Kitchen  She  will need to see Dr. [name] for followup in 4-6 days.   Problem 2.  DIARRHEA:  The patient did have several loose stools prior to  admission.  There was a concern for possible infectious diarrhea since she  has been on antibiotics.  The patient had a stool specimen sent to lab which  returned as negative for C. difficile.  No further treatment was needed.   The patient's other medical problems remained stable during her stay.  She  will continue on all of her home medications.   CONDITION ON DISCHARGE:  The patient's condition at time of discharge  dictation is stable and improved.           ______________________________  Rosalyn Gess Norins, M.D. [facility]     MEN/MEDQ  D:  02/28/2006  T:  02/28/2006  Job:  [id number]   cc:   Titus Dubin. [name], M.D. [facility]  [phone] [place]  [place] [address]

## 2011-01-25 NOTE — Op Note (Signed)
[facility]  Patient:    [name], [name] Visit Number: [id number] MRN: [id number]          Service Type: Attending:  Carlisle Beers. Dorothyann Gibbs, M.D. Dictated by:   Carlisle Beers. Dorothyann Gibbs, M.D. Proc. Date: 07/20/01                             Operative Report  PREOPERATIVE DIAGNOSIS:  End-stage osteoarthritis, right knee.  POSTOPERATIVE DIAGNOSIS:  End-stage osteoarthritis, right knee.  OPERATION PERFORMED:  Right LCS total knee replacement.  SURGEON:  [name] L. Dorothyann Gibbs, M.D.  ASSISTANT:  Arnoldo Morale, P.A.  ANESTHESIA:  General.  PATHOLOGY:  End-stage osteoarthritis with bone-against-bone in multiple compartments.  INDICATIONS FOR PROCEDURE:  Chronic right knee pain with osteoarthritis resistant to all conservative measures.  DESCRIPTION OF PROCEDURE:  Under general anesthesia the right leg was prepared with DuraPrep and draped as a sterile field.  A midline incision was made. The patella was everted, multiple small vessels were cauterized.  The femur was sized as borderline between standard plus and standard and the standard plus cuts were made.  The tibial guide was used first, proximal tibial resection was carried out.  Using the first femoral guide, an intercondylar drill hole was placed.  Then the anterior and posterior flare of the distal femur were resected.  The flexion gap was 10 mm.  Using the intramedullary guide, the distal femoral cut was made, recessing guide was then used.  The lamina spreader was then inserted and remnants of the menisci and cruciates were resected as well as spurs on the back of the femoral condyle.  Once this was completed, the tibia was sized as 2.5.  A center peg hole was placed, trial reduction of a 2.5 tibia with a 10 mm bearing and a standard plus femur reveals the femur does not really fit satisfactorily overlapping just slightly.  The femur was then down-sized by replacing the anterior and posterior  cutting block and then the recessing guide cutting block.  Once this was completed, trial reduction reveals excellent fit, alignment and stability and the bearing size was 12.5 mm.  Permanent components were then obtained. All components were then cemented in place after of course, osteotomizing the patella and putting the three peg holes in place.  Once the cement hardened, excess was removed.  The knee was stable through full flexion and extension to varus and valgus, anterior and posterior stress.  The tourniquet was let down at one hour, multiple small vessels were cauterized.  There tends to be some venous bleeding at first with the knee in extension but by the end of the case with the knee flexed, it no longer bled when brought back out into extension. One Hemovac drain was put in place.  The foot had 2+ dorsalis pedis pulse throughout the case once the tourniquet was let down.  The wound was then closed in layers with #1 Surgidac, 0 and 2-0 Vicryl and skin clips.  Total operative time approximately an hour and 15 minutes to  an hour and 20 minutes.  The patient returned to recovery in good condition. Dictated by:   Carlisle Beers. Dorothyann Gibbs, M.D. Attending:  Carlisle Beers. Dorothyann Gibbs, M.D. DD:  07/20/01 TD:  07/21/01 Job: [id number]

## 2011-01-25 NOTE — H&P (Signed)
[name], [name]                        ACCOUNT NO.:  [id number]   MEDICAL RECORD NO.:  [id number]                   PATIENT TYPE:  EMS   LOCATION:  MAJO                                 FACILITY:  [facility]   PHYSICIAN:  Wanda Plump, MD [facility]                 DATE OF BIRTH:  1916-01-05   DATE OF ADMISSION:  09/07/2003  DATE OF DISCHARGE:                                HISTORY & PHYSICAL   CHIEF COMPLAINT:  Syncope.   HISTORY OF PRESENT ILLNESS:  [name] is an 77 year old white female  patient of [name]. [name], M.D. who was doing well until she went to the  mall and had a syncope.  Apparently, she was sitting and all of the sudden  became very weak.  Her speech became slurred.  She closed her eyes and  evidently passed out.  EMS was called and at the time of the ambulance  arrival the patient was alert, oriented, and nonfocal.  The patient does not  remember well what happened and her daughter is not at bedside so I cannot  get more details.  She does know that she did not have any bladder or bowel  incontinence or tongue bite.  At the present, she is felling well and back  to baseline.   PAST MEDICAL HISTORY:  1. Paroxysmal atrial fibrillation.  2. Polymyalgia rheumatica.  3. COPD.  4. Diverticular disease.  5. DJD with chronic low back pain status post bilateral total knee     replacement and status post rotator cuff repair on the left shoulder.  6. Partial gastrectomy.  7. Thyroid nodule.  8. The patient had a cath this year and it was negative for coronary artery     disease; however, she did have ejection fraction of 25% and 3+ mitral     regurgitation.  She, however, sustained myocardial insult on April 2004     either from coronary vasospasm or Tako-Tsubo syndrome   FAMILY HISTORY:  Noncontributory at this time.   SOCIAL HISTORY:  The patient is a widow.  Lives with her daughter in  [place].  She drinks very seldom and has never been a smoker.   REVIEW OF  SYSTEMS:  She denies any fever, chills, chest pain, shortness of  breath, or dyspnea on exertion.  She is not coughing at present but states  that she had pneumonia diagnosed on August 14, 2003 status post treatment.  She very seldom has some nausea but has not been vomiting or having  diarrhea.  She does have chronic low back pain.   MEDICATIONS:  1. Vicodin.  2. Neurontin 100 mg one p.o. b.i.d.  3. Trazodone 100 mg one p.o. b.i.d.  4. KCl 20 mEq one p.o. daily.  5. Lasix 20 mg one p.o. daily.  6. Accolate 20 mg one p.o. b.i.d.  7. Prednisone 5 mg one p.o. daily.  8. Methadone 5  mg one p.o. q.6 h.  9. Prozac 20 mg one p.o. daily.  10.      Miacalcin nasal spray.  11.      Serevent.  12.      Pulmicort.  13.      Aspirin.  14.      Pravachol.  15.      Nitroglycerin.   ALLERGIES:  She is ALLERGIC to PENICILLIN, MORPHINE, TEQUIN, and  AMINOPHYLLINE.   PHYSICAL EXAMINATION:  GENERAL:  At the time of my examination, the patient  was alert, oriented, cooperative, and coherent.  VITAL SIGNS:  She was afebrile, blood pressure was 112/48 with a pulse of 60  and O2 saturation of 98%.  NECK:  Full range of motion.  LUNGS:  Clear to auscultation bilaterally even though her breath sounds were  slightly decreased.  CARDIOVASCULAR:  Regular rate and rhythm.  She had a soft systolic murmur.  EXTREMITIES:  No edema.  NEUROLOGICAL:  Her speech was fluent and clear.  Motor exam was normal  including symmetric face.  Gait was not tested.  Extraocular movements are  intact and the pupils were equal and reactive.   LABORATORY DATA AND X-RAYS:  Chest x-rays show enlarged heart without CHF  evidence.  Urinalysis shows 7-10 white blood cells.  First set of cardiac  enzymes were negative. A pH was 7.36, CO2 51, O2 63.  PT/PTT normal.  Potassium 3.6, BUN 25, creatinine 1.2, blood sugar 124.  LFTs were normal.  White cells were 3.9, hemoglobin 10.7 which is very close from the previous  one back  in October 2004.  Platelets 195.  EKG shows sinus bradycardia with  a bifascicular block.  I do not have an old EKG to compare this with but  from previous reports she had a RBBB and lateral ST elevations.  On today's  EKG, I see no ST abnormalities, however.  CT of the head is reportedly  nonacute.   ASSESSMENT AND PLAN:  [name] has been admitted with a syncopal  episode.  The differential diagnosis is large and includes a transient  ischemic attack, stroke, bradyarrhythmia or tachyarrhythmia or even a  pulmonary embolus.  I am planning to put her on telemetry, rule out acute  cardiovascular event.  I will attempt to schedule an echo to assess for a  left ventricular or atrial thrombus, also carotid ultrasounds.  We will  check a D-dimer.  Otherwise we will continue with her usual list of  medicines.  We also need to get an old chart to compare the actual EKGs side  by side.                                                Wanda Plump, MD [facility]    JEP/MEDQ  D:  09/07/2003  T:  09/07/2003  Job:  [id number]

## 2011-01-25 NOTE — Discharge Summary (Signed)
NAMENEEYA, PRIGMORE                        ACCOUNT NO.:  [id number]   MEDICAL RECORD NO.:  [id number]                   PATIENT TYPE:  INP   LOCATION:  0357                                 [facility]ACILITY[facility]  [facility]   PHYSICIAN:  Titus Dubin. [name], M.D. [facility]         DATE OF BIRTH:  12/18/1915   DATE OF ADMISSION:  10/27/2002  DATE OF DISCHARGE:  10/29/2002                                 DISCHARGE SUMMARY   ADMISSION DIAGNOSIS:  Acute-on-chronic pain in the setting of a fall with  inability to ambulate due to this.   DISCHARGE DIAGNOSIS:  Acute-on-chronic pain in the setting of a fall with  inability to ambulate due to this.   BRIEF HISTORY:  The patient is an 77 year old white female who has chronic  pain syndrome for which she is followed by Dr. Thyra Breed in the pain  clinic.  She is also followed by Dr. Donell Beers for psychiatric diagnoses.  This is in the context of a remote history of electroshock therapy.   She fell after going to the mailbox this morning, having lost her balance.  She fell on her left side and was unable to get up by herself.  She was  transferred to the emergency room.  There was no evidence of fracture on the  films in the emergency room but she stated she was unable to move, sit, or  walk without excruciating pain.   HOSPITAL COURSE:  Evaluation included films of the femur which revealed no  fracture.  The left humerus revealed osteopenia.  Degenerative disk disease  was noted on films of the hip, as was osteopenia.  Degenerative disk disease  was noted at L4-L5 and L5-S1.  A bone scan was subsequently done because of  intractable pain and revealed no occult fracture.   Laboratory studies revealed white count 32,000; significantly she had been  treated for an upper respiratory tract infection on Tequin, which was  completed in the hospital.  Hematocrit was 35.5.  Her differential was  normal.  Chemistry studies revealed an albumin of 3.4, an alk phos of  37.   There was a dichotomy in the severity of the pain.  On the day of discharge  the nurse's notes in the computer revealed pain of 1-3 which was relieved by  topical heat.  She expressed to me severe pain which prevented sleep.  She  was in no distress on the day of discharge and was sitting comfortably in a  chair when I entered the room.  Vital signs were stable.   Dilaudid had been added for breakthrough pain after Dr. Felicity Coyer discussed  the case with Dr. Vear Clock.  This was to be continued as an outpatient.   I explained to the patient that there was nothing we were doing at this time  that could not be completed at home and that continued stay would be a  question of utilization review and appropriateness of  hospitalization.   Her only concern was being transported up the stairs at home.  She will be  on a higher level.  Bathroom facilities are available up there.   MEDICATIONS:  She was discharged on her same schedule of Neurontin,  potassium, Ativan, methadone, Vicodin, Pulmicort, Wellbutrin, Accolate,  trazodone, torsemide, Evista, and prednisone.   New medications included:  1. Dilaudid 2 mg q.4h. as needed for breakthrough pain.  She was given #30     pills with instructions to follow up with Dr. Vear Clock for any additional     refills.  2. She was prescribed Miacalcin nasal spray for the osteopenia and for bone     pain.  There have been at least anecdotal incidences of pain relief using     the Miacalcin.  It will be instilled one nostril daily, alternating     nostrils each day.   DISPOSITION:  Arrangements have been made for nonemergency transport home by  ambulance with transport up the stairs.  She will have physical therapy at  home beginning February 23.  This has been arranged through [facility].   FOLLOW-UP:  Her follow-up for the chronic pain will be with Dr. Vear Clock.  She was encouraged to continue to see Dr. Donell Beers.   DISCHARGE STATUS:   Unchanged to slightly improved; it was difficult to  assess these subjective complaints.   DIET:  There was no change in diet.                                               Titus Dubin. [name], M.D. [facility]    [facility]/MEDQ  D:  10/30/2002  T:  10/30/2002  Job:  [id number]   cc:   Loraine Leriche L. Vear Clock, M.D.  [address]  [place]  [place] [address]  Fax: [phone]   Aundra Dubin, M.D.   Archer Asa, M.D.

## 2011-01-25 NOTE — Procedures (Signed)
[facility]  Patient:    [name], [name]                       MRN: [id number] Proc. Date: 01/23/01 Attending:  Thyra Breed, M.D. CC:         Titus Dubin. [name], M.D. [facility]  Aundra Dubin, M.D.   Procedure Report  PROCEDURE:  Lumbar epidural steroid injection.  DIAGNOSIS:  Lumbar spinal stenosis with underlying lumbar spondylosis.  INTERVAL HISTORY:  The patient initially stated she had no benefits from her last injection but her daughter tells me that she was out in the garden all yesterday digging and enjoying the day and it sounds like she has improved somewhat after the first injection. She rates her pain at 8/10 but this is likely secondary to the exacerbations of her activities yesterday.  CURRENT MEDICATIONS: 1. Hydrocodone 5/500 one p.o. q. 6h p.r.n. 2. Methadone 5 mg one every five hours four tablets per day. 3. Serevent, maxair, exoteen, trazodone, Accolate, E-vista, K-Dur, Demadex    and prednisone.  PHYSICAL EXAMINATION:  Blood pressure 109/44, heart rate 84, respiratory rate 84, respiratory rate 16, O2 saturations 93%, pain level is 8/10. The patient has good healing from a previous injection site.  DESCRIPTION OF PROCEDURE:  After informed consent was obtained, the patient was placed in the sitting position and monitored. The patients back was prepped with Betadine x 3. A skin wheal was raised at the L3-4 interspace with 1 percent lidocaine. A 20 gauge Tuohy needle was introduced to the lumbar epidural space to loss of resistance to preservative free normal saline. The depth was 6 cm. There was no CSF nor blood. 80 mg of Medrol and 8 ml of preservative free normal saline was gently injected.  CONDITION POST PROCEDURE:  Stable.  DISCHARGE INSTRUCTIONS:  Resume previous diet. Limitations in activities per instruction sheet. Continue on current medications with the exception of increasing the patients hydrocodone to 7.5/500 one  p.o. q. 6h p.r.n. #100 with no refill. Followup with me in one to two weeks for consideration for repeat injection. DD:  01/23/01 TD:  01/24/01 Job: [id number]

## 2011-01-25 NOTE — Discharge Summary (Signed)
[name], CORRIE NO.:  [id number]   MEDICAL RECORD NO.:  [id number]          PATIENT TYPE:  INP   LOCATION:  2628                         FACILITY:  [facility]   PHYSICIAN:  Rene Paci, M.D. LHCDATE OF BIRTH:  09-28-1915   DATE OF ADMISSION:  11/18/2005  DATE OF DISCHARGE:  11/21/2005                                 DISCHARGE SUMMARY   DISCHARGE DIAGNOSES:  1.  Left-sided atypical chest pain, status post pulmonary, gastrointestinal,      hematology oncology workup with no etiology identified; see details      below.  2.  Chronic pain followed by pain management for cervical and lumbar spine      pain.  Continue home pain regimen.  3.  History of remote breast cancer, status post mastectomy.  Follow up with      Dr. Dalene Carrow.  4.  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  Skeletal survey here negative.      Outpatient followup.  SPEP and UPEP results pending at time of      discharge.  5.  History of asthma and chronic obstructive pulmonary disease.  Stable for      two or three years.  Continue home treatment.  6.  History of vasospasm myocardial infarction in 2004 with nonobstructive      coronary artery disease by catheterization.  7.  History of bilateral thyroid nodules, chronic but with increasing size,      status post fine-needle aspiration in August 2005 with benign findings.      Patient to follow up as needed.  8.  Anemia.  Normocytic.  No signs or symptoms of active bleeding.  Normal      ferritin, iron, folate, B12.  9.  Status post remote partial      gastrectomy.  No clear etiology known at this time.  9.  History of paroxysmal atrial fibrillation.  Not a Coumadin candidate.      Off anticoagulation singe April 2004.  10. History of osteoarthritis with bilateral total knee replacement and a      left shoulder surgery in March 2005.  11. Dyslipidemia.  12. History of remote PMR.   CONSULTATIONS THIS HOSPITALIZATION:  1.  Pulmonary, [place],  Dr. Delton Coombes.  2.  GI, [place], Dr. Christella Hartigan.  3.  Hem/onc service, Dr. Dalene Carrow and nurse practitioner Marlowe Kays.   PROCEDURES:  1.  __________ showing no PE but chronic right middle lobe atelectasis and      bronchiectasis changes, bilateral thyroid nodules with increase in the      right size as well as scattered sclerotic lesion within multiple      vertebrae as well as new lesion in the fourth left rib.  2.  Skeletal survey negative. No confirmation of such lesions.  No evidence      of mets or myeloma.  3.  SPEP and UPEP drawn and pending at time of dictation as well as other      labs ordered by oncology prior to discharge.   DISCHARGE MEDICATIONS:  Are as prior to admission and include:  1.  Methadone 5 mg every four hours while awake with Vicodin 7.5/500 one      p.o. every four hours while awake.  2.  Lyrica 25 mg p.o. b.i.d.  3.  Trazodone 100 mg p.o. every h.s.  4.  Pravachol 80 mg p.o. every h.s.  5.  Also new this hospitalization are:  Protonix 40 mg daily.  6.  MiraLax 17 grams p.o. every day as needed for bowel movements.  7.  Pulmicort 200 mcg inhaler two puffs every a.m.  8.  Serevent 50 mcg inhaler b.i.d.  9.  Accolate 20 mg p.o. every day.  10. Demadex 20 mg p.o. every day.  11. Potassium 20 mEq p.o. every day.  12. Prozac 20 mg p.o. every day.   Hospital followup will be arranged with primary care physician, Dr. Marga Melnick, in the next week and also with Dr. Dalene Carrow, oncologist, as previously  scheduled in July unless evaluation needed sooner, to be determined by  primary M.D.   CONDITION ON DISCHARGE:  Clinically unchanged with continued left-sided pain  of uncertain etiology but with negative workup.  Hemodynamic stable.   HOSPITAL COURSE BY PROBLEM:  1.  Left-sided pain.  The patient is a complicated 77 year old woman who had      had two or three days of severe left-sided pain around the area of her      ribs but difficult to pinpoint whether this was  in the left upper      quadrant, left flank, musculoskeletal or chest.  Because of uncertainty      in this regard, she was admitted for further evaluation and rule out MI.      Telemetry was obtained.  Cardiac enzymes were negative times three.      Because of the CT findings questioning right middle lobe chronic      atelectasis, pulmonary consult was obtained by request of primary M.D.      and seen by Dr. Delton Coombes, who felt that these changes were chronic and not      acute, no evidence of obstructing central lesion and no indication for      bronchoscopy as the patient was likely asymptomatic from these changes.      Continued medication for COPD and no other pulmonary issues.  She was      also seen by GI to question etiology of this pain, who agreed with      continuation of proton pump inhibitor as well as treatment of her      chronic constipation due to narcotics but did not feel that her pain was      clearly GI in origin.  Likewise, the only new finding was a new      sclerotic lesion on CT in the left fourth rib, possibly pertaining to      the patient's symptoms, although she was not palpably tender on exam.      Because of her history of breast cancer and history of MGUS, an oncology      consult was obtained who agreed with re-order of SPEP, UPEP and several      other labs.  Skeletal bone survey was negative for any lesions, and thus      no further indication of diagnosis or need for treatment.  The patient's      pain has been controlled with her home regimen of every four hour      methadone plus Vicodin in addition to her home Lyrica  as managed by pain      clinic.  As there has been no clear etiology to identify her cause of      pain, she will continue with treatment of her pain as prior to admission      with continued outpatient surveillance by primary M.D.  No acute issues      could be identified during this hospitalization, and the patient appears     medically  stable for continued outpatient surveillance.  2.  Other medical issues are as listed above.  There have been no changes in      her regimen except for the addition of proton pump inhibitor and MiraLax      to her regimen.  All other medications are as previously listed.      Rene Paci, M.D. [facility]  Electronically Signed     VL/MEDQ  D:  11/21/2005  T:  11/22/2005  Job:  [id number]

## 2011-01-25 NOTE — H&P (Signed)
[name], [name] NO.:  [id number]   MEDICAL RECORD NO.:  [id number]          PATIENT TYPE:  INP   LOCATION:  0449                         FACILITY:  [facility]   PHYSICIAN:  Wanda Plump, MD [facility]    DATE OF BIRTH:  [birth year]   DATE OF ADMISSION:  08/31/2004  DATE OF DISCHARGE:  09/01/2004                                HISTORY & PHYSICAL   PRIMARY CARE DOCTOR:  Titus Dubin. [name], M.D. [facility]   CHIEF COMPLAINT:  Hurting all over.   HISTORY OF PRESENT ILLNESS:  [name] is an 77 year old white female  who came here today to the emergency room with her sister with a 3-4 day  history of hurting all over.  The pain is described as severe in all her  bones and muscles and she also states that even her skin is hurting.  Along  with that she has developed some cough, fever, chills, and she feels that  she is trembling all over.  She also is complaining of a global headache.  She saw her primary care [name] a couple of days ago who diagnosed her  with bronchitis.  She got a shot of possibly an antibiotic.  She was started  on prednisone and Biaxin, and because she was not better, she came to the  emergency room.   PAST MEDICAL HISTORY:  1.  Paroxysmal atrial fibrillation.  2.  Polymyalgia rheumatica.  3.  COPD.  4.  Diverticular disease.  5.  DJD.  6.  Status post bilateral total knee replacement.  7.  Recent surgery, done in March 2005, was done to repair her left      shoulder.  8.  Partial gastrectomy.  9.  Thyroid nodule.  10. In 2004, she had a cardiac insult.  She had a followup cardiac      catheterization.  It did not show any coronary artery disease but it did      show an ejection fraction of 25% with 3+ mitral regurgitation.  The      etiology of the insult was believed to be a coronary vascular spasm      versus a Tako-Tasuvo syndrome.  However, in December 2004, she was      admitted for syncope and at that time an echo showed a normal ejection     fraction.   FAMILY HISTORY:  Noncontributory at this time.   SOCIAL HISTORY:  Does not smoke and does not drink.   REVIEW OF SYSTEMS:  She denies any sore throat, sinus congestion.  Again she  has a slight cough with gray sputum.  There is no hemoptysis.  Denies any  shortness of breath or chest pain but she admits to some dyspnea on  exertion.  She was nauseated earlier today but is not vomiting, has no  diarrhea, and the stools are normal.  She denies any stiff neck or  photophobia.  She just drove back from [place] early this week.   MEDICATIONS:  A medication list was carefully reviewed with the patient's  sister and what she is taking is as  follows:  1.  Lyrica 25 mg two p.o. t.i.d.  2.  Aspirin one a day.  3.  Pulmicort twice a day.  4.  Serevent 50 once a day.  5.  Miacalcin two sprays each day.  6.  Vicodin 10 every four hours.  7.  Trazodone 150 one p.o. every day.  8.  Potassium 20 half pill a day.  9.  Torsemide 20 half pill a day.  10. Accolate 20 mg b.i.d.  11. Methadone 5 mg q.6h.  12. Prozac 20 one p.o. every day.  13. Pravachol 80 one p.o. every day.  14. Nitroglycerin 0.4 p.r.n.  15. Vitamins.   ALLERGIES:  1.  PENICILLIN.  2.  MORPHINE.   PHYSICAL EXAMINATION:  GENERAL:  At the time of my exam, the patient is  alert, oriented.  She does not look toxic, but she is certainly diaphoretic.  VITAL SIGNS:  Temperature at the emergency room was 101.4, pulse 79,  respirations 16, blood pressure 123/56.  The patient weighs 140 pounds.  O2  sat on room air was 95%.  HEENT:  The oropharynx showed dry membranes and it is slightly red.  Nose is  slightly congested.  Sinuses are not tender to palpation.  NECK:  Full range of motion.  LUNGS:  She has a few wheezes and rhonchi bilaterally.  CARDIOVASCULAR:  Regular rate and rhythm.  ABDOMEN:  Not distended.  Soft.  It is slightly tender at the mid aspect but  there is no mass or rebound.  She has good bowel  sounds.  EXTREMITIES:  Calves are symmetric.  There is no pitting edema.  NEUROLOGIC:  External ocular movements are intact.  The speech is normal.  Face is symmetric.  Motor is nonfocal.  The patient is coherent and  cooperative.   LABORATORY AND X-RAYS:  Chest x-ray shows cardiomegaly and what seems to be  pulmonary venous hypertension.  There is a right infrahilar density that  coincides with a density seen in a previous chest CT.  At that time, that  area was described as a partially collapsed right middle lobe.  This density  seems to be more prominent at this point.   White count is 8.9, hemoglobin 10.8, platelets 205.  Potassium 4.3, BUN 0.9,  blood sugar 125.  LFTs are normal.  Influenza swabs for A and B are  negative.   ASSESSMENT/PLAN:  1.  The patient has been admitted with a febrile illness.  Most likely this      is a viral syndrome, however, we will empirically cover her with Avelox      and Tamiflu, provide supportive care.  We will go ahead and draw blood      cultures.  2.  The patient has an abnormal chest x-ray.  We will go ahead and redo a CT      of the chest to further evaluate the right infrahilar density.  3.  The rest of her medical problems seem to be stable.  At this point, we      will continue with all her home medications.  4.  We will provide deep vein thrombosis prophylaxis with Lovenox.       JEP/MEDQ  D:  08/31/2004  T:  09/01/2004  Job:  [id number]

## 2011-01-25 NOTE — H&P (Signed)
[name], [name]                        ACCOUNT NO.:  [id number]   MEDICAL RECORD NO.:  [id number]                   PATIENT TYPE:  INP   LOCATION:  4738                                 FACILITY:  [facility]   PHYSICIAN:  Salvadore Farber, M.D. [facility]         DATE OF BIRTH:  Jun 28, 1916   DATE OF ADMISSION:  12/13/2002  DATE OF DISCHARGE:                                HISTORY & PHYSICAL   CHIEF COMPLAINT:  Falls.   HISTORY OF PRESENT ILLNESS:  The patient is an 77 year old lady recently  hospitalized with a cardiac event, presenting with chest pain and marked  elevation in enzymes.  Cardiac catheterization demonstrated angiographically  normal coronary arteries but a large apical wall motion abnormality which  did not well correlate with vascular distribution.  There was distinct  apical ballooning resulting in ejection fraction of 25-35%.  There was 3+  mitral regurgitation.   She is now readmitted having suffered a fall last evening while getting on  to a bedside commode.  The daughter also notes recent weakness, intermittent  confusion, and possible chest pain.  The daughter states that the patient  has chest pain but the patient currently denies.  The patient further denies  shortness of breath, orthopnea, PND.   PAST MEDICAL HISTORY:  1. Paroxysm atrial fibrillation during acute event.  2. Polymyalgia rheumatica.  3. Diverticular disease.  4. Depression.  5. COPD.  6. Degenerative disc disease.  7. Chronic back pain.  8. Status post partial gastrectomy.  9. Thyroid nodule.  10.      Gastritis.   ALLERGIES:  PENICILLIN, MORPHINE, TEQUIN, AMINOPHYLLINE.   CURRENT MEDICATIONS:  1. Acolate.  2. Prednisone 5 mg q.d.  3. Trazodone.  4. Pulmicort.  5. Serevent.  6. KCl.  7. Methadone.  8. Pravachol.  9. Imdur 30 mg q.d.  10.      Digoxin 0.125 mg q.d.  11.      Toprol XL 12.5 mg q.d.  12.      Prozac.  13.      Demodex 20 mg q.d.  14.      Captopril 6.25 mg  t.i.d.  15.      Coumadin 1 mg q.h.s.  16.      Protonix.   SOCIAL HISTORY:  The patient is widowed and lives with her daughter in  [place].  Denies tobacco or alcohol use.   FAMILY HISTORY:  Noncontributory.   REVIEW OF SYMPTOMS:  Not obtainable at this time.  The patient states it is  negative in detail but has some concern that she is not forthcoming.   PHYSICAL EXAMINATION:  GENERAL:  Pleasant woman in no distress with heart  rate of 64, blood pressure of 90/40.  Oxygen saturation 94% on two liters by  nasal cannula.  NECK:  She has no jugular venous distention.  LUNGS:  Clear to auscultation.  HEART:  She has a regular rate and  rhythm with a prominent S4.  There is a  2/6 holosystolic murmur loudest at the apex.  There is no S3 or rub.  ABDOMEN:  Soft, nontender, and nondistended.  There is no  hepatosplenomegaly.  Bowel sounds are normal.  EXTREMITIES:  Warm without clubbing, cyanosis, edema, or ulceration.   LABORATORY DATA:  Remarkable for hematocrit 33, platelets 304,000.  INR 1.7.  D-dimer 0.6.  Troponin 0.02, CK-MB 1.2, BNP 825.  BUN 41, creatinine 1.2,  sodium 141.   Electrocardiogram demonstrates sinus bradycardia with right bundle branch  block and lateral ST elevations.   IMPRESSION/RECOMMENDATIONS:  The patient had recent myocardial injury in the  absence of coronary artery disease.  Possible diagnoses at that time include  coronary artery spasm and Tako-Tsubo syndrome.  I feel the latter is the  more likely diagnosis given the lack of correlation between a wall motion  abnormality and an intravascular distribution.   She is now admitted with falls and questionable chest discomfort.  The exam  is not remarkable for volume overload.  She has markedly elevated B-type  natriuretic peptide.  We will plan on discontinuing her beta blockade due to  her decreased heart rate and falls.  We will discontinue her Imdur given the  low blood pressure and falls.  We will  discontinue the Coumadin given her  falls.  We will plan on checking a digoxin level in the morning and holding  the digoxin until it returns.  We will continue ACE inhibitor and Demodex.  We will involve internal medicine for assistance in her care.  Given the  questionable history of chest pain, we will cycle enzymes to exclude any  further myocardial damage but we think this is unlikely.                                               Salvadore Farber, M.D. [facility]    WED/MEDQ  D:  12/13/2002  T:  12/13/2002  Job:  [id number]   cc:   Titus Dubin. [name], M.D. [facility]   Pricilla Riffle, M.D. [facility]

## 2011-01-25 NOTE — H&P (Signed)
[name], [name]                        ACCOUNT NO.:  [id number]   MEDICAL RECORD NO.:  [id number]                   PATIENT TYPE:  INP   LOCATION:  0343                                 FACILITY:  [facility]   PHYSICIAN:  Titus Dubin. [name], M.D. [facility]         DATE OF BIRTH:  Dec 19, 1915   DATE OF ADMISSION:  11/26/2002  DATE OF DISCHARGE:                                HISTORY & PHYSICAL   HISTORY OF PRESENT ILLNESS:  [name] is an 77 year old, white female,  admitted with intractable atypical chest pain and nausea and vomiting x 1.   Please see the history and physical on discharge summary from [facility], 10/27/02 to 10/29/38.  Postdischarge, the patient has  continue to have migratory left thoracic pain.  More often than not, it  starts posteriorly, will migrate around the thorax to the anterior chest.  It has been worse with movement.  It seemingly may be worse in the morning.  It is described as sharp with some burning.  She can identify no trigger  such as eating, drinking, position change, or bowel function.  The pain will  last hours.   Today she had nausea and vomiting x 1 as she entered the office.  Prior to  that time, she was not having any GI symptomatology.   Significantly, she has chronic pain syndrome for which she is seen by Loraine Leriche  L. Vear Clock, M.D.   PAST MEDICAL HISTORY:  1. She has had a C-section x 3.  2. Partial gastrectomy for an ulcer.  3. Cholecystectomy and hysterectomy.  4. Remotely, she had electroshock therapy for depression.  5. COPD with an asthmatic component with relatively good control of late.  6. Degenerative joint disease and has been followed by Aundra Dubin,     M.D.  7. Polymyalgia rheumatica and has been on chronic steroids.  8. Diverticulosis.   FAMILY HISTORY:  Arthritis and possible ischemic bowel disease.  Her  grandson has had melanoma.  One son has a tremor.   SOCIAL HISTORY:  She does not  smoke or drink.  She is a widow and lives with  her daughter.   MEDICINES:  1. Trazodone 200 mg q.h.s.  2. Accolade 20 mg b.i.d.  3. Demadex 20 mg daily.  4. Serevent 1 inhalation q.12h.  5. Methadone 5 mg t.i.d.  6. Pulmicort 3 puffs each morning.  7. Prednisone 5 mg daily.  8. Wellbutrin 100 mg 3 daily.  9. Neurontin 100 mg 2 q.8h.  10.      Calcium b.i.d.  11.      Potassium 20 mEq daily.  12.      Miacalcin nasal spray.   AS NEEDED MEDICATIONS:  1. Lidocaine patch.  2. Hydrocodone APAP.  3. Nebulized albuterol.   ALLERGIES:  She is intolerant or allergic to PENICILLIN, MORPHINE,  AMINOPHYLLINE.  TEQUIN has caused nausea.   REVIEW OF SYSTEMS:  She has had no fever, chills, or sweats with her present  illness.  She has had no cardiopulmonary symptoms except for the chest pain.  There has been no change in her bowels.  Genitourinary review of systems is  also negative.   PHYSICAL EXAMINATION:  GENERAL:  She appears weak and chronically  debilitated.  VITAL SIGNS:  Temperature 97.5, pulse 85, respiratory rate 18, blood  pressure 130/60.  HEENT:  There is arterial narrowing on fundal exam.  Ptosis is present on  the left.  Hearing aids are noted bilaterally.  Dental hygiene is extremely  good.  CARDIOPULMONARY:  Unremarkable except for an S4 with slurring.  Pedal pulses  are decreased.  She has lipedema.  Equivocal Homan sign is present  bilaterally but greater on the right.  ABDOMEN:  Doughy with some tenderness and dullness in the right upper  quadrant.  She has no lymphadenopathy or definite organomegaly.  NEUROLOGIC:  She is generally weak and sits in a wheelchair.  Affect is  somewhat flat.   LABORATORY DATA:  EKG reveals marked left axis deviation with a right bundle-  branch block with left anterior fascicular block.  The PR interval was  within normal limits.   IMPRESSION AND PLAN:  She will now be admitted with atypical chest pain  which was not present prior  to her fall, for which she was hospitalized at  [facility].  It is to be noted that plain films and a bone scan were  negative for musculoskeletal injury.  It is possible this represents some  radiculopathy, although there is no definite consistent level to the area of  pain.  Pulmonary thromboembolic and cardiac etiology will be ruled out,  although this is an atypical history.   If these evaluations and neurologic evaluation are negative, then follow-up  with Mark L. Vear Clock, M.D., at the chronic pain center will be pursued.                                               Titus Dubin. [name], M.D. [facility]    WFH/MEDQ  D:  11/26/2002  T:  11/26/2002  Job:  [id number]

## 2011-03-06 ENCOUNTER — Other Ambulatory Visit: Payer: Self-pay | Admitting: Internal Medicine

## 2011-03-06 MED ORDER — PRAVASTATIN SODIUM 40 MG PO TABS: 40.0000 mg | ORAL_TABLET | Freq: Every day | ORAL | Status: DC

## 2011-03-06 NOTE — Telephone Encounter (Signed)
done

## 2011-05-30 ENCOUNTER — Encounter: Payer: Self-pay | Admitting: *Deleted

## 2011-05-30 ENCOUNTER — Ambulatory Visit (INDEPENDENT_AMBULATORY_CARE_PROVIDER_SITE_OTHER): Payer: PRIVATE HEALTH INSURANCE | Admitting: Internal Medicine

## 2011-05-30 DIAGNOSIS — Z8719 Personal history of other diseases of the digestive system: Secondary | ICD-10-CM

## 2011-05-30 DIAGNOSIS — R110 Nausea: Secondary | ICD-10-CM

## 2011-05-30 DIAGNOSIS — E785 Hyperlipidemia, unspecified: Secondary | ICD-10-CM

## 2011-05-30 LAB — HEPATIC FUNCTION PANEL
Bilirubin, Direct: 0 mg/dL (ref 0.0–0.3)
Total Bilirubin: 0.4 mg/dL (ref 0.3–1.2)
Total Protein: 6.6 g/dL (ref 6.0–8.3)

## 2011-05-30 LAB — BASIC METABOLIC PANEL
Chloride: 106 mEq/L (ref 96–112)
Potassium: 5.3 mEq/L — ABNORMAL HIGH (ref 3.5–5.1)
Sodium: 144 mEq/L (ref 135–145)

## 2011-05-30 LAB — CBC WITH DIFFERENTIAL/PLATELET
Basophils Relative: 0.8 % (ref 0.0–3.0)
Eosinophils Relative: 3.1 % (ref 0.0–5.0)
HCT: 35.6 % — ABNORMAL LOW (ref 36.0–46.0)
Hemoglobin: 12.1 g/dL (ref 12.0–15.0)
Lymphs Abs: 0.8 10*3/uL (ref 0.7–4.0)
MCV: 90.5 fl (ref 78.0–100.0)
Monocytes Absolute: 0.3 10*3/uL (ref 0.1–1.0)
Monocytes Relative: 8.6 % (ref 3.0–12.0)
RBC: 3.94 Mil/uL (ref 3.87–5.11)
WBC: 3.4 10*3/uL — ABNORMAL LOW (ref 4.5–10.5)

## 2011-05-30 LAB — LIPID PANEL
LDL Cholesterol: 69 mg/dL (ref 0–99)
Total CHOL/HDL Ratio: 2
VLDL: 19.6 mg/dL (ref 0.0–40.0)

## 2011-05-30 MED ORDER — RANITIDINE HCL 150 MG PO TABS: 150.0000 mg | ORAL_TABLET | Freq: Two times a day (BID) | ORAL | Status: DC

## 2011-05-30 NOTE — Patient Instructions (Signed)
The triggers for dyspepsia or "heart burn"  include stress; the "aspirin family" ; alcohol; peppermint; and caffeine (coffee, tea, cola, and chocolate). The aspirin family would include aspirin and the nonsteroidal agents such as ibuprofen &  Naproxen. Tylenol would not cause reflux. If having dyspepsia ; food & drink should be avoided for @ least 2 hours before going to bed.  

## 2011-05-30 NOTE — Progress Notes (Signed)
  Subjective:    Patient ID: [name], female    DOB: 1916-01-29, 77 y.o.   MRN: [id number]  HPI Nausea: Onset: 3 weeks ago   Severity: some anorexia Frequency: intermittent , every other or every 2 days; the nausea is often present in the morning before she eats  Duration: 2-3 hrs  Better with: no Worse with: no triggers except possibly alcohol Symptoms Vomiting: no  Diarrhea: no  Constipation: yes, some  Melena/BRBPR: no  Hematemesis: no  Fever/Chills: no  Dysuria/hematuria/pyuria: no  Wt loss: no  EtOH use: yes, occasionally  NSAIDs/ASA: yes, 81 mg ASA  Past Surgeries:partial gastrectomy due to ulcers  She denies scleral icterus or jaundice, clay colored stools, coke-colored urine, or abdominal pain.   She also denies dysphagia.  Her daughter asked if she should continue the statin. Her last lipids were June/2011. At that time and were excellent. Hepatic function tests were also normal at that time.       Review of Systems     Objective:   Physical Exam she appears well-nourished and in no acute distress.  There is no scleral icterus or jaundice present.  The oral pharynx reveals no erythema; dental hygiene is excellent. She has an upper partial  Chest is clear to auscultation with no increased work of breathing.  She has a regular rhythm with a grade 1 systolic murmur.  She has no lymphadenopathy about the neck or axilla  Abdomen is soft and nontender. She has no organomegaly or masses. There is a well-healed epigastric operative scar  She does have lipedema.          Assessment & Plan:  #1 nausea, intermittent times several weeks  #2 ulcer, past medical history of partial gastrectomy  Plan: See orders and recommendations.

## 2011-11-04 ENCOUNTER — Encounter: Payer: Self-pay | Admitting: Internal Medicine

## 2011-11-04 ENCOUNTER — Ambulatory Visit (INDEPENDENT_AMBULATORY_CARE_PROVIDER_SITE_OTHER): Payer: Medicare Other | Admitting: Internal Medicine

## 2011-11-04 DIAGNOSIS — J029 Acute pharyngitis, unspecified: Secondary | ICD-10-CM

## 2011-11-04 DIAGNOSIS — T887XXA Unspecified adverse effect of drug or medicament, initial encounter: Secondary | ICD-10-CM

## 2011-11-04 LAB — POCT RAPID STREP A (OFFICE): Rapid Strep A Screen: NEGATIVE

## 2011-11-04 NOTE — Patient Instructions (Signed)
Zicam Melts or Zinc lozenges ; vitamin C 2000 mg daily; & Echinacea for 4-7 days. Report fever, exudate("pus") or progressive pain.  

## 2011-11-04 NOTE — Progress Notes (Signed)
  Subjective:    Patient ID: [name], female    DOB: [birth year], 78 y.o.   MRN: [id number]  HPI Respiratory tract infection Onset/symptoms:2/23 as ST  Exposures (illness/environmental/extrinsic):granddaughter & daughter ill Progression of symptoms:2/24 as chest congestion Treatments/response:none Present symptoms: Fever/chills/sweats:no Frontal headache:no Facial pain:no Nasal purulence:no Dental pain:no Lymphadenopathy:no Wheezing/shortness of breath:no Cough/sputum/hemoptysis:minor dry cough Pleuritic pain:no Past medical history: Seasonal allergies/asthma:formerly, not now Smoking history:never           Review of Systems she describes a vague chest tightness but not classic angina. She had significant malaise last night with a sore throat. She states that she "may be getting better" and denies chest tightness at this time     Objective:   Physical Exam General appearance:good health ;well nourished; no acute distress or increased work of breathing is present.  No  lymphadenopathy about the head, neck, or axilla noted. Appears younger than stated age   Eyes: No conjunctival inflammation or lid edema is present.   Ears:  External ear exam shows no significant lesions or deformities.  Otoscopic examination reveals clear canals, tympanic membranes are intact bilaterally without bulging, retraction, inflammation or discharge. Aids bilaterally  Nose:  External nasal examination shows no deformity or inflammation. Nasal mucosa are pink and moist without lesions or exudates. No septal dislocation or deviation.No obstruction to airflow.   Oral exam: Dental hygiene is good; lips and gums are healthy appearing.There is no oropharyngeal erythema or exudate noted. Upper partial. Hoarse  Neck:  No deformities, thyromegaly, masses, or tenderness noted.   Heart:  Normal rate and regular rhythm. S1 and S2 normal without gallop,  click, rub or other extra sounds. Grade 1/6  systolic murmur   Lungs:Chest clear to auscultation; no wheezes, rhonchi,rales ,or rubs present.No increased work of breathing.  BS decreased  Extremities:  No cyanosis, edema, or clubbing  noted    Skin: Warm & dry w/o jaundice or tenting.          Assessment & Plan:  #1 respiratory tract symptoms, possibly viral. By history he seem to be resolving. Strep testing will be conducted if she's been exposed to family members who are ill  #2 chest tightness without definite angina; asymptomatic at present

## 2011-11-08 DIAGNOSIS — J189 Pneumonia, unspecified organism: Secondary | ICD-10-CM

## 2011-11-08 HISTORY — DX: Pneumonia, unspecified organism: J18.9

## 2011-12-05 ENCOUNTER — Encounter: Payer: Self-pay | Admitting: Family Medicine

## 2011-12-05 ENCOUNTER — Ambulatory Visit (INDEPENDENT_AMBULATORY_CARE_PROVIDER_SITE_OTHER): Payer: Medicare Other | Admitting: Family Medicine

## 2011-12-05 ENCOUNTER — Telehealth: Payer: Self-pay | Admitting: Internal Medicine

## 2011-12-05 VITALS — BP 105/70 | HR 80 | Temp 98.2°F | Ht 59.0 in | Wt 127.0 lb

## 2011-12-05 DIAGNOSIS — J189 Pneumonia, unspecified organism: Secondary | ICD-10-CM

## 2011-12-05 DIAGNOSIS — R062 Wheezing: Secondary | ICD-10-CM

## 2011-12-05 MED ORDER — ALBUTEROL SULFATE HFA 108 (90 BASE) MCG/ACT IN AERS: 2.0000 | INHALATION_SPRAY | RESPIRATORY_TRACT | Status: DC | PRN

## 2011-12-05 MED ORDER — BENZONATATE 200 MG PO CAPS: 200.0000 mg | ORAL_CAPSULE | Freq: Three times a day (TID) | ORAL | Status: AC | PRN

## 2011-12-05 MED ORDER — IPRATROPIUM BROMIDE 0.02 % IN SOLN
0.5000 mg | Freq: Once | RESPIRATORY_TRACT | Status: AC
  Administered 2011-12-05: 0.5 mg via RESPIRATORY_TRACT

## 2011-12-05 MED ORDER — ALBUTEROL SULFATE (5 MG/ML) 0.5% IN NEBU
2.5000 mg | INHALATION_SOLUTION | Freq: Once | RESPIRATORY_TRACT | Status: AC
  Administered 2011-12-05: 2.5 mg via RESPIRATORY_TRACT

## 2011-12-05 MED ORDER — DOXYCYCLINE HYCLATE 100 MG PO TABS: 100.0000 mg | ORAL_TABLET | Freq: Two times a day (BID) | ORAL | Status: AC

## 2011-12-05 NOTE — Progress Notes (Signed)
  Subjective:    Patient ID: [name], female    DOB: March 05, 1916, 78 y.o.   MRN: [id number]  HPI Cough- daughter reports pt has been feeling 'very very badly'.  Tm today was 100.  + sick contacts.  + wet cough but not productive.  + wheezing.  No SOB.  Denies nasal congestion but daughter says yes.  L ear pain.  No facial pain.  Has hx of COPD/asthma but not currently on inhalers.  sxs started 3-4 days.  No vomiting but mild nausea.  No diarrhea.   Review of Systems For ROS see HPI     Objective:   Physical Exam  Vitals reviewed. Constitutional: She appears well-developed and well-nourished. No distress.  HENT:  Head: Normocephalic and atraumatic.  Nose: Nose normal.  Mouth/Throat: Oropharynx is clear and moist. No oropharyngeal exudate.       TMs WNL bilaterally No TTP over sinuses  Neck: Normal range of motion. Neck supple.  Cardiovascular: Normal rate and regular rhythm.   Pulmonary/Chest: Effort normal. No respiratory distress. She has no wheezes.       CTA on R, crackles on L No retractions, increased WOB, SOB Able to speak in full sentences w/out difficulty + wet cough  Lymphadenopathy:    She has no cervical adenopathy.  Skin: Skin is warm and dry.          Assessment & Plan:

## 2011-12-05 NOTE — Telephone Encounter (Signed)
Pt seen by Dr Beverely Low today.

## 2011-12-05 NOTE — Telephone Encounter (Signed)
Because of her age and other health issues; she should be seen at the  [facility] urgent care Center as  she did not keep the appointment today.

## 2011-12-05 NOTE — Patient Instructions (Signed)
Start the Doxycycline twice daily for likely pneumonia- take w/ food Use the albuterol inhaler- 2 puffs every 4 hrs- as needed Add Mucinex to thin congestion Use the tessalon as needed for cough- can add delsym as needed If any worsening shortness of breath, high fever, confusion or other concerns- please go to ER Hang in there!!

## 2011-12-05 NOTE — Telephone Encounter (Signed)
Caller: [name]/Daughter; PCP: Marga Melnick; CB#: [phone]; ; ; Call regarding Fever of 100 With Cough and Congestion; states onset of cold symptoms 12/02/11 but temp now 100.0.  Made appt for her 12/05/11 1530.  States she sat up on the side of the bed, but feels poorly and did not want to do her usual activities for the day.  Per protocol, emergent symptoms denied; advised to keep appt this afternoon in office.

## 2011-12-08 NOTE — Assessment & Plan Note (Signed)
Based on clinical exam.  Recommended CXR but pt's daughter did not want to go for this today.  Will start broad spectrum abx.  Reviewed importance of inhaler use.  Cough meds prn.  Pt well appearing, in no respiratory distress- does not meet criteria for admission.  Discussed w/ pt and daughter that there is the possibility that she could get sick very quickly and this could be very serious given her age.  Reviewed red flags that should prompt immediate attention- pt and daughter expressed understanding and agreement.

## 2012-04-27 ENCOUNTER — Ambulatory Visit (INDEPENDENT_AMBULATORY_CARE_PROVIDER_SITE_OTHER): Payer: Medicare Other | Admitting: Internal Medicine

## 2012-04-27 ENCOUNTER — Encounter: Payer: Self-pay | Admitting: Internal Medicine

## 2012-04-27 VITALS — BP 102/50 | HR 76 | Temp 97.6°F | Ht 60.0 in | Wt 126.0 lb

## 2012-04-27 DIAGNOSIS — I252 Old myocardial infarction: Secondary | ICD-10-CM

## 2012-04-27 DIAGNOSIS — E785 Hyperlipidemia, unspecified: Secondary | ICD-10-CM

## 2012-04-27 DIAGNOSIS — N189 Chronic kidney disease, unspecified: Secondary | ICD-10-CM

## 2012-04-27 DIAGNOSIS — D649 Anemia, unspecified: Secondary | ICD-10-CM

## 2012-04-27 DIAGNOSIS — R413 Other amnesia: Secondary | ICD-10-CM

## 2012-04-27 DIAGNOSIS — R5383 Other fatigue: Secondary | ICD-10-CM

## 2012-04-27 DIAGNOSIS — Z111 Encounter for screening for respiratory tuberculosis: Secondary | ICD-10-CM

## 2012-04-27 LAB — CBC WITH DIFFERENTIAL/PLATELET
Basophils Relative: 0.8 % (ref 0.0–3.0)
Eosinophils Absolute: 0.1 10*3/uL (ref 0.0–0.7)
Lymphocytes Relative: 27.2 % (ref 12.0–46.0)
MCHC: 33.5 g/dL (ref 30.0–36.0)
Neutrophils Relative %: 59.8 % (ref 43.0–77.0)
Platelets: 166 10*3/uL (ref 150.0–400.0)
RBC: 3.75 Mil/uL — ABNORMAL LOW (ref 3.87–5.11)
WBC: 2.4 10*3/uL — ABNORMAL LOW (ref 4.5–10.5)

## 2012-04-27 LAB — HEPATIC FUNCTION PANEL
Alkaline Phosphatase: 45 U/L (ref 39–117)
Bilirubin, Direct: 0.1 mg/dL (ref 0.0–0.3)
Total Bilirubin: 0.5 mg/dL (ref 0.3–1.2)
Total Protein: 6.4 g/dL (ref 6.0–8.3)

## 2012-04-27 LAB — TSH: TSH: 1.82 u[IU]/mL (ref 0.35–5.50)

## 2012-04-27 LAB — LIPID PANEL
HDL: 56.7 mg/dL (ref >39.00)
LDL Cholesterol: 112 mg/dL — ABNORMAL HIGH (ref 0–99)
Total CHOL/HDL Ratio: 3
VLDL: 20.4 mg/dL (ref 0.0–40.0)

## 2012-04-27 LAB — BASIC METABOLIC PANEL
CO2: 30 mEq/L (ref 19–32)
Calcium: 8.6 mg/dL (ref 8.4–10.5)
Creatinine, Ser: 1.1 mg/dL (ref 0.4–1.2)

## 2012-04-27 NOTE — Patient Instructions (Addendum)
Review and correct the record as indicated. Please share record with all medical staff seen.   If you activate My Chart; the results can be released to you as soon as they populate from the lab. If you choose not to use this program; the labs have to be reviewed, copied & mailed   causing a delay in getting the results to you.  Return in 72 hrs to read TB skin test & pick up form.

## 2012-04-27 NOTE — Progress Notes (Signed)
Subjective:    Patient ID: [name], female    DOB: 1916-02-18, 78 y.o.   MRN: [id number]  HPI She'll be staying at [facility] for 2 weeks while her daughter is out of town; she denies any acute issues.  Her COPD with  reactive airways component has been stable; she is not having to use her rescue inhaler. She was treated for pneumonia as an outpatient in March. Her Pneumovax has been given after the age of 10  The chronic shoulder and back pain well controlled with narcotic pain medication, topical lidocaine, and methadone from Dr. Vear Clock  She is on fluoxetine 10 mg daily; she denies significant depression at this time  She is also on ranitidine for reflux. She denies hoarseness , dysphagia, melena, rectal bleeding.    Review of Systems Her last labs were 05/30/11. Her creatinine was 1.2, BUN 25, and GFR 46.77. Her white blood count was 3400; it had ranged from 2400-6300. Her hemoglobin has ranged from 30.8-35.6. Platelet count was normal 9/20 the head done as well as 130,000.  She denies epistaxis, hemoptysis, hematuria or other bleeding dyscrasia.  She did stop her statin she her lipids were at goal on 05/30/11. She has a history of  myocardial infarction remotely.  She continues to have malaise and fatigue. She is helping provide care to her daughter has been treated for metastatic malignancy.     Objective:   Physical Exam Gen.: Appears younger than stated age .Well-nourished in appearance.   Eyes: No corneal or conjunctival inflammation noted.  Neck: No deformities, masses, or tenderness noted. Thyroid normal. Lungs: Normal respiratory effort; chest expands symmetrically. Lungs are clear to auscultation without rales, wheezes, or increased work of breathing. Heart: Normal rate and rhythm. Normal S1 and S2. No gallop, click, or rub. Grade 1/6 systolic murmur   Abdomen: Bowel sounds normal; abdomen soft and nontender. No masses, organomegaly or hernias noted.                                                                    Musculoskeletal/extremities: Mild lordosis noted of  the thoracic  spine. No clubbing or cyanosis noted. Range of motion  normal .Marked OA finger changes.Nail health  Good.Lipedema. Vascular: Carotid, radial artery, dorsalis pedis    pulses are full and equal.Decreased PTP. No bruits present. Neurologic: Unable to give date ; President's name; or daughter's birthdate. Deep tendon reflexes symmetrical and normal.     Skin: Intact without suspicious lesions or rashes. Lymph: No cervical, axillary lymphadenopathy present. Psych: Mood and affect are normal. Normally interactive                                                                                         Assessment & Plan:  See problem list. Past medical/surgical history, family history, and family history were reviewed and updated. Problem list was reviewed and updated.  Plan: See  orders to assess status of issues listed in the Problem List to allow continuity of care while in the nursing facility.

## 2012-04-30 LAB — TB SKIN TEST
Induration: 0 mm
TB Skin Test: NEGATIVE

## 2012-05-05 ENCOUNTER — Telehealth: Payer: Self-pay | Admitting: Internal Medicine

## 2012-05-05 NOTE — Telephone Encounter (Signed)
Forms completed and given to Amy [employer]), I left message on VM for [place] with status update

## 2012-05-05 NOTE — Telephone Encounter (Signed)
Mardene Celeste from [facility] called inquiring on the status of the forms she faxed over for this patient. She is needing these back ASAP so they can have everything ready for her to move in on September 5th. Call back at [phone] ext [phone]. Leave a message if no answer.

## 2012-11-25 ENCOUNTER — Ambulatory Visit (INDEPENDENT_AMBULATORY_CARE_PROVIDER_SITE_OTHER): Payer: Medicare Other | Admitting: Internal Medicine

## 2012-11-25 ENCOUNTER — Encounter: Payer: Self-pay | Admitting: Internal Medicine

## 2012-11-25 VITALS — BP 118/62 | HR 81 | Temp 97.6°F | Ht 59.08 in | Wt 112.0 lb

## 2012-11-25 DIAGNOSIS — H833X9 Noise effects on inner ear, unspecified ear: Secondary | ICD-10-CM

## 2012-11-25 DIAGNOSIS — F0390 Unspecified dementia without behavioral disturbance: Secondary | ICD-10-CM

## 2012-11-25 NOTE — Patient Instructions (Addendum)
Review and correct the record as indicated. Please share record with all medical staff seen. Please review the medication list in the After Visit Summary provided.Please verify the medication name (this may be  brand or generic) & correct dosage. Write the name of the prescribing physician to the right of the medication and share this with all medical staff seen at each appointment. This will help provide continuity of care; help optimize therapeutic interventions;and help prevent drug:drug adverse reaction.  Please see if Dr. Vear Clock has a recommendations concerning behavioral issues. I do not want to prescribe anything that would impair her good pain control.  Low-dose sertraline may be an option; but  I defer to his expertise.

## 2012-11-25 NOTE — Progress Notes (Signed)
  Subjective:    Patient ID: [name], female    DOB: [birth year], 79 y.o.   MRN: [id number]  HPI  Due to her daughter's health issues placement in a facility is being pursued. Memory deficit has progressed as per her daughter; also her hearing is deteriorating. Intermittently argumentative & angry @ times w/o physical violence to date.  Past medical history/family history/social history were all reviewed and updated.     Review of Systems All medications are prescribed by Dr. Thyra Breed, chronic pain specialist. Her asthmatic condition has resolved as of 2008. 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     Objective:   Physical Exam Gen.: Healthy and well-nourished in appearance. Very alert and cooperative throughout exam.Appears younger than stated age  Head: Normocephalic without obvious abnormalities. Eyes: No corneal or conjunctival inflammation noted.  Ears: External  ear exam reveals no significant lesions or deformities. Hearing aid on R; L aid missing. Marked hearing loss .   Nose: External nasal exam reveals no deformity or inflammation. Nasal mucosa are pink and moist. No lesions or exudates noted.  Mouth: Oral mucosa and oropharynx reveal no lesions or exudates. Teeth in good repair. Upper partial Neck: No deformities, masses, or tenderness noted. Thyroid normal.No NVD @15  degreees Lungs: Normal respiratory effort; chest expands symmetrically. Lungs are clear to auscultation without rales, wheezes, or increased work of breathing. Heart: Normal rate and rhythm. Normal S1 and S2. No gallop, click, or rub. S4 gallop w/o murmur. Abdomen: Bowel sounds normal; abdomen soft and nontender. No masses, organomegaly or hernias noted.No HJR.                        Musculoskeletal/extremities: Accentuated curvature of upper thoracic  Spine. No clubbing or  Cyanosis.Marked lipidema.Significant DIP  deformities noted. Range of motion normal .Tone & strength  Normal. Nail  health good. Able to lie down & sit up with minimal help. Negative SLR bilaterally Vascular: Carotid, radial artery, & dorsalis pedis  pulses are full and equal. No bruits present.Decreased PTP due to lipidema Neurologic: Disoriented x3 ; unable to give year or President. Deep tendon reflexes symmetrical and normal.  Gait broad & deliberate; using cane.        Skin: Intact without suspicious lesions or rashes. Lymph: No cervical, axillary lymphadenopathy present. Psych: Mood and affect are normal. Normally interactive                                                                                        Assessment & Plan:  #1 dementia with some behavioral issues; not persistent or significant to date. I do not believe starting generic Aricept would offer significant benefit and could be associated with significant gastric issues. #2 hearing loss #3 RAD , resolved Plan: See orders and recommendations

## 2012-12-07 ENCOUNTER — Telehealth: Payer: Self-pay | Admitting: Internal Medicine

## 2012-12-07 NOTE — Telephone Encounter (Signed)
Noted  

## 2012-12-07 NOTE — Telephone Encounter (Signed)
Caller: [name]/Child; Phone: [phone]; Reason for Call: Caller states he is seeking to get the patient into an assisted living facility.  States Dr.  [name] did a complete review of patient records 11/25/12, and family needs specific forms completed as well.  Will bring forms to the front desk to give to staff for completion.  Krs/can

## 2012-12-11 ENCOUNTER — Ambulatory Visit (INDEPENDENT_AMBULATORY_CARE_PROVIDER_SITE_OTHER): Payer: Medicare Other | Admitting: *Deleted

## 2012-12-11 DIAGNOSIS — Z0279 Encounter for issue of other medical certificate: Secondary | ICD-10-CM

## 2012-12-11 DIAGNOSIS — Y9210 Unspecified residential institution as the place of occurrence of the external cause: Secondary | ICD-10-CM

## 2012-12-11 DIAGNOSIS — Z111 Encounter for screening for respiratory tuberculosis: Secondary | ICD-10-CM

## 2012-12-11 DIAGNOSIS — Z593 Problems related to living in residential institution: Secondary | ICD-10-CM

## 2012-12-14 LAB — TB SKIN TEST: Induration: 0 mm

## 2012-12-16 ENCOUNTER — Telehealth: Payer: Self-pay | Admitting: *Deleted

## 2012-12-16 ENCOUNTER — Telehealth: Payer: Self-pay

## 2012-12-16 MED ORDER — LORAZEPAM 0.5 MG PO TABS: ORAL_TABLET | ORAL | Status: DC

## 2012-12-16 NOTE — Telephone Encounter (Signed)
Verbal ok given to Michelle. 

## 2012-12-16 NOTE — Telephone Encounter (Signed)
Orders needing for skilled nursing and physical therapy.Please advise

## 2012-12-16 NOTE — Telephone Encounter (Signed)
Christine from [employer] was calling to indicate patient is new at there facility and is very anxious and nervous, patient suffering from anxiety. Requesting medication be sent to [facility].  Per Dr.Hopper Lorazepam 0.5 mg 1 by mouth every 8-12 hours as needed # 30/0 refills   Christine aware rx sent to [facility] pharmacy

## 2012-12-16 NOTE — Telephone Encounter (Signed)
OK BUT DJD & dementia will limit PT response

## 2013-07-06 ENCOUNTER — Encounter (HOSPITAL_BASED_OUTPATIENT_CLINIC_OR_DEPARTMENT_OTHER): Payer: Self-pay | Admitting: Emergency Medicine

## 2013-07-06 ENCOUNTER — Emergency Department (HOSPITAL_BASED_OUTPATIENT_CLINIC_OR_DEPARTMENT_OTHER): Payer: Medicare Other

## 2013-07-06 ENCOUNTER — Inpatient Hospital Stay (HOSPITAL_BASED_OUTPATIENT_CLINIC_OR_DEPARTMENT_OTHER)
Admission: EM | Admit: 2013-07-06 | Discharge: 2013-07-09 | DRG: 085 | Disposition: A | Discharge status: Nursing Home (Includes ICF) | Payer: Medicare Other | Attending: Internal Medicine | Admitting: Internal Medicine

## 2013-07-06 DIAGNOSIS — M1990 Unspecified osteoarthritis, unspecified site: Secondary | ICD-10-CM

## 2013-07-06 DIAGNOSIS — F411 Generalized anxiety disorder: Secondary | ICD-10-CM

## 2013-07-06 DIAGNOSIS — IMO0001 Reserved for inherently not codable concepts without codable children: Secondary | ICD-10-CM | POA: Diagnosis present

## 2013-07-06 DIAGNOSIS — I252 Old myocardial infarction: Secondary | ICD-10-CM

## 2013-07-06 DIAGNOSIS — Z7982 Long term (current) use of aspirin: Secondary | ICD-10-CM

## 2013-07-06 DIAGNOSIS — F3289 Other specified depressive episodes: Secondary | ICD-10-CM

## 2013-07-06 DIAGNOSIS — S065XAA Traumatic subdural hemorrhage with loss of consciousness status unknown, initial encounter: Principal | ICD-10-CM

## 2013-07-06 DIAGNOSIS — T887XXA Unspecified adverse effect of drug or medicament, initial encounter: Secondary | ICD-10-CM

## 2013-07-06 DIAGNOSIS — K5730 Diverticulosis of large intestine without perforation or abscess without bleeding: Secondary | ICD-10-CM

## 2013-07-06 DIAGNOSIS — Z9181 History of falling: Secondary | ICD-10-CM

## 2013-07-06 DIAGNOSIS — F329 Major depressive disorder, single episode, unspecified: Secondary | ICD-10-CM

## 2013-07-06 DIAGNOSIS — G894 Chronic pain syndrome: Secondary | ICD-10-CM | POA: Diagnosis present

## 2013-07-06 DIAGNOSIS — F09 Unspecified mental disorder due to known physiological condition: Secondary | ICD-10-CM

## 2013-07-06 DIAGNOSIS — Z853 Personal history of malignant neoplasm of breast: Secondary | ICD-10-CM

## 2013-07-06 DIAGNOSIS — S065X9A Traumatic subdural hemorrhage with loss of consciousness of unspecified duration, initial encounter: Principal | ICD-10-CM | POA: Diagnosis present

## 2013-07-06 DIAGNOSIS — N390 Urinary tract infection, site not specified: Secondary | ICD-10-CM

## 2013-07-06 DIAGNOSIS — H9190 Unspecified hearing loss, unspecified ear: Secondary | ICD-10-CM | POA: Diagnosis present

## 2013-07-06 DIAGNOSIS — J45909 Unspecified asthma, uncomplicated: Secondary | ICD-10-CM | POA: Diagnosis present

## 2013-07-06 DIAGNOSIS — R262 Difficulty in walking, not elsewhere classified: Secondary | ICD-10-CM

## 2013-07-06 DIAGNOSIS — R609 Edema, unspecified: Secondary | ICD-10-CM

## 2013-07-06 DIAGNOSIS — Z9889 Other specified postprocedural states: Secondary | ICD-10-CM

## 2013-07-06 DIAGNOSIS — R4189 Other symptoms and signs involving cognitive functions and awareness: Secondary | ICD-10-CM

## 2013-07-06 DIAGNOSIS — J449 Chronic obstructive pulmonary disease, unspecified: Secondary | ICD-10-CM

## 2013-07-06 DIAGNOSIS — S0180XA Unspecified open wound of other part of head, initial encounter: Secondary | ICD-10-CM | POA: Diagnosis present

## 2013-07-06 DIAGNOSIS — Z880 Allergy status to penicillin: Secondary | ICD-10-CM

## 2013-07-06 DIAGNOSIS — R413 Other amnesia: Secondary | ICD-10-CM

## 2013-07-06 DIAGNOSIS — I6200 Nontraumatic subdural hemorrhage, unspecified: Secondary | ICD-10-CM

## 2013-07-06 DIAGNOSIS — M353 Polymyalgia rheumatica: Secondary | ICD-10-CM | POA: Diagnosis present

## 2013-07-06 DIAGNOSIS — Y9210 Unspecified residential institution as the place of occurrence of the external cause: Secondary | ICD-10-CM | POA: Diagnosis present

## 2013-07-06 DIAGNOSIS — E860 Dehydration: Secondary | ICD-10-CM | POA: Diagnosis present

## 2013-07-06 DIAGNOSIS — D649 Anemia, unspecified: Secondary | ICD-10-CM

## 2013-07-06 DIAGNOSIS — N189 Chronic kidney disease, unspecified: Secondary | ICD-10-CM

## 2013-07-06 DIAGNOSIS — F03918 Unspecified dementia, unspecified severity, with other behavioral disturbance: Secondary | ICD-10-CM | POA: Diagnosis present

## 2013-07-06 DIAGNOSIS — J4489 Other specified chronic obstructive pulmonary disease: Secondary | ICD-10-CM

## 2013-07-06 DIAGNOSIS — F0390 Unspecified dementia without behavioral disturbance: Secondary | ICD-10-CM

## 2013-07-06 DIAGNOSIS — H9193 Unspecified hearing loss, bilateral: Secondary | ICD-10-CM

## 2013-07-06 DIAGNOSIS — E785 Hyperlipidemia, unspecified: Secondary | ICD-10-CM

## 2013-07-06 DIAGNOSIS — D709 Neutropenia, unspecified: Secondary | ICD-10-CM | POA: Diagnosis present

## 2013-07-06 DIAGNOSIS — Z418 Encounter for other procedures for purposes other than remedying health state: Secondary | ICD-10-CM

## 2013-07-06 DIAGNOSIS — B9620 Unspecified Escherichia coli [E. coli] as the cause of diseases classified elsewhere: Secondary | ICD-10-CM | POA: Diagnosis present

## 2013-07-06 DIAGNOSIS — S0181XA Laceration without foreign body of other part of head, initial encounter: Secondary | ICD-10-CM

## 2013-07-06 DIAGNOSIS — I4891 Unspecified atrial fibrillation: Secondary | ICD-10-CM

## 2013-07-06 DIAGNOSIS — Z79899 Other long term (current) drug therapy: Secondary | ICD-10-CM

## 2013-07-06 DIAGNOSIS — Z2989 Encounter for other specified prophylactic measures: Secondary | ICD-10-CM

## 2013-07-06 DIAGNOSIS — I609 Nontraumatic subarachnoid hemorrhage, unspecified: Secondary | ICD-10-CM

## 2013-07-06 DIAGNOSIS — F0391 Unspecified dementia with behavioral disturbance: Secondary | ICD-10-CM | POA: Diagnosis present

## 2013-07-06 DIAGNOSIS — Z66 Do not resuscitate: Secondary | ICD-10-CM | POA: Diagnosis present

## 2013-07-06 DIAGNOSIS — W19XXXA Unspecified fall, initial encounter: Secondary | ICD-10-CM

## 2013-07-06 DIAGNOSIS — Z23 Encounter for immunization: Secondary | ICD-10-CM

## 2013-07-06 DIAGNOSIS — A498 Other bacterial infections of unspecified site: Secondary | ICD-10-CM | POA: Diagnosis present

## 2013-07-06 DIAGNOSIS — N179 Acute kidney failure, unspecified: Secondary | ICD-10-CM

## 2013-07-06 LAB — MRSA PCR SCREENING: MRSA by PCR: NEGATIVE

## 2013-07-06 LAB — CBC WITH DIFFERENTIAL/PLATELET
Eosinophils Absolute: 0.1 10*3/uL (ref 0.0–0.7)
HCT: 30.6 % — ABNORMAL LOW (ref 36.0–46.0)
Hemoglobin: 9.8 g/dL — ABNORMAL LOW (ref 12.0–15.0)
Lymphocytes Relative: 25 % (ref 12–46)
Lymphs Abs: 0.8 10*3/uL (ref 0.7–4.0)
MCHC: 32 g/dL (ref 30.0–36.0)
Monocytes Relative: 12 % (ref 3–12)
Neutro Abs: 1.8 10*3/uL (ref 1.7–7.7)
Neutrophils Relative %: 60 % (ref 43–77)
Platelets: 185 10*3/uL (ref 150–400)
RBC: 3.52 MIL/uL — ABNORMAL LOW (ref 3.87–5.11)
WBC: 3 10*3/uL — ABNORMAL LOW (ref 4.0–10.5)

## 2013-07-06 LAB — BASIC METABOLIC PANEL
BUN: 26 mg/dL — ABNORMAL HIGH (ref 6–23)
CO2: 26 mEq/L (ref 19–32)
Calcium: 9.2 mg/dL (ref 8.4–10.5)
Chloride: 100 mEq/L (ref 96–112)
GFR calc Af Amer: 28 mL/min — ABNORMAL LOW (ref >90)
GFR calc non Af Amer: 24 mL/min — ABNORMAL LOW (ref >90)
Potassium: 4.6 mEq/L (ref 3.5–5.1)
Sodium: 135 mEq/L (ref 135–145)

## 2013-07-06 LAB — URINALYSIS, ROUTINE W REFLEX MICROSCOPIC
Bilirubin Urine: NEGATIVE
Glucose, UA: NEGATIVE mg/dL
Nitrite: POSITIVE — AB
Protein, ur: NEGATIVE mg/dL
Specific Gravity, Urine: 1.011 (ref 1.005–1.030)

## 2013-07-06 LAB — URINE MICROSCOPIC-ADD ON

## 2013-07-06 LAB — PROTIME-INR: Prothrombin Time: 13.6 seconds (ref 11.6–15.2)

## 2013-07-06 LAB — TROPONIN I: Troponin I: <0.3 ng/mL (ref <0.30)

## 2013-07-06 MED ORDER — NITROFURANTOIN MONOHYD MACRO 100 MG PO CAPS: 100.0000 mg | ORAL_CAPSULE | Freq: Once | ORAL | Status: DC

## 2013-07-06 MED ORDER — HYDROCODONE-ACETAMINOPHEN 5-325 MG PO TABS
1.0000 | ORAL_TABLET | Freq: Four times a day (QID) | ORAL | Status: AC | PRN
  Administered 2013-07-06 – 2013-07-07 (×2): 1 via ORAL
  Filled 2013-07-06 (×2): qty 1

## 2013-07-06 MED ORDER — METHADONE HCL 10 MG PO TABS
5.0000 mg | ORAL_TABLET | Freq: Every day | ORAL | Status: DC
  Administered 2013-07-07 – 2013-07-09 (×3): 5 mg via ORAL
  Filled 2013-07-06 (×3): qty 1

## 2013-07-06 MED ORDER — BISACODYL 5 MG PO TBEC
5.0000 mg | DELAYED_RELEASE_TABLET | Freq: Every day | ORAL | Status: DC | PRN
  Administered 2013-07-09: 5 mg via ORAL
  Filled 2013-07-06: qty 1

## 2013-07-06 MED ORDER — ALUM & MAG HYDROXIDE-SIMETH 200-200-20 MG/5ML PO SUSP
30.0000 mL | Freq: Four times a day (QID) | ORAL | Status: DC | PRN
Start: 2013-07-06 — End: 2013-07-09

## 2013-07-06 MED ORDER — SODIUM CHLORIDE 0.9 % IJ SOLN: 3.0000 mL | Freq: Two times a day (BID) | INTRAMUSCULAR | Status: DC

## 2013-07-06 MED ORDER — SODIUM CHLORIDE 0.9 % IV SOLN
INTRAVENOUS | Status: DC
  Administered 2013-07-06 – 2013-07-07 (×3): via INTRAVENOUS

## 2013-07-06 MED ORDER — ONDANSETRON HCL 4 MG PO TABS
4.0000 mg | ORAL_TABLET | Freq: Four times a day (QID) | ORAL | Status: DC | PRN
  Administered 2013-07-08: 4 mg via ORAL
  Filled 2013-07-06: qty 1

## 2013-07-06 MED ORDER — PANTOPRAZOLE SODIUM 40 MG PO TBEC
40.0000 mg | DELAYED_RELEASE_TABLET | Freq: Every day | ORAL | Status: DC
  Administered 2013-07-06 – 2013-07-09 (×4): 40 mg via ORAL
  Filled 2013-07-06 (×4): qty 1

## 2013-07-06 MED ORDER — ACETAMINOPHEN 650 MG RE SUPP: 650.0000 mg | Freq: Four times a day (QID) | RECTAL | Status: DC | PRN

## 2013-07-06 MED ORDER — ACETAMINOPHEN 325 MG PO TABS
650.0000 mg | ORAL_TABLET | Freq: Four times a day (QID) | ORAL | Status: DC | PRN
  Administered 2013-07-06 – 2013-07-09 (×6): 650 mg via ORAL
  Filled 2013-07-06 (×6): qty 2

## 2013-07-06 MED ORDER — ONDANSETRON HCL 4 MG/2ML IJ SOLN
4.0000 mg | Freq: Four times a day (QID) | INTRAMUSCULAR | Status: DC | PRN
  Administered 2013-07-06: 4 mg via INTRAVENOUS

## 2013-07-06 MED ORDER — TRAZODONE 25 MG HALF TABLET
25.0000 mg | ORAL_TABLET | Freq: Once | ORAL | Status: AC
  Administered 2013-07-06: 25 mg via ORAL
  Filled 2013-07-06 (×2): qty 1

## 2013-07-06 MED ORDER — CIPROFLOXACIN IN D5W 400 MG/200ML IV SOLN
400.0000 mg | Freq: Once | INTRAVENOUS | Status: AC
  Administered 2013-07-06: 400 mg via INTRAVENOUS
  Filled 2013-07-06: qty 200

## 2013-07-06 MED ORDER — CIPROFLOXACIN IN D5W 400 MG/200ML IV SOLN
400.0000 mg | INTRAVENOUS | Status: DC
  Administered 2013-07-07: 400 mg via INTRAVENOUS
  Filled 2013-07-06: qty 200

## 2013-07-06 MED ORDER — ONDANSETRON HCL 4 MG/2ML IJ SOLN
INTRAMUSCULAR | Status: AC
  Filled 2013-07-06: qty 2

## 2013-07-06 NOTE — ED Provider Notes (Signed)
CSN: [id number]     Arrival date & time 07/06/13  9147 History   First MD Initiated Contact with Patient 07/06/13 0701     Chief Complaint  Patient presents with  . Fall  . Head Laceration   (Consider location/radiation/quality/duration/timing/severity/associated sxs/prior Treatment) HPI Comments: Pt is a 79 y.o. female with Pmhx as above who presents with unwittnessed fall from [facility] place this morning in her closet. Pt does nto know why she fell, states she got dizzy but is unable to elaborate.  She complains of pain over area of R facial lac, denies pain elsewhere.  Patient is a 79 y.o. female presenting with fall and scalp laceration. The history is provided by the patient and the EMS personnel. The history is limited by the condition of the patient. No language interpreter was used.  Fall This is a new problem. The current episode started less than 1 hour ago. The problem occurs rarely. The problem has not changed since onset.Associated symptoms include headaches. Pertinent negatives include no chest pain, no abdominal pain and no shortness of breath. Nothing aggravates the symptoms. Nothing relieves the symptoms. She has tried nothing for the symptoms. The treatment provided no relief.  Head Laceration The current episode started less than 1 hour ago. The problem occurs rarely. Associated symptoms include headaches. Pertinent negatives include no chest pain, no abdominal pain and no shortness of breath. Nothing aggravates the symptoms. Nothing relieves the symptoms. She has tried nothing for the symptoms. The treatment provided mild relief.    Past Medical History  Diagnosis Date  . Asthma   . Depression   . Diverticulosis   . Polymyalgia rheumatica 2008  . Depression   . Osteoarthritis   . Retinal detachment     left eye S/P silicone injection  . Myocardial infarct   . Pneumonia 11/2011    treated as OP  . Breast cancer 1987   Past Surgical History  Procedure Laterality  Date  . Cesarean section      x3  . Cholecystectomy    . Biopsy thyroid  04-2004    needle  . Mrsa cellulitis  02-2006  . Shoulder surgery  11-2003  . Colonoscopy w/ polypectomy    . Partial gastrectomy  1977    [place], [place]  . Double mastectomy  1987    no post op therapy   Family History  Problem Relation Age of Onset  . Hypertension Mother   . Cancer Sister     intestinal  . Breast cancer Daughter   . Diabetes Neg Hx   . Stroke Neg Hx    History  Substance Use Topics  . Smoking status: Never Smoker   . Smokeless tobacco: Not on file  . Alcohol Use: No   OB History   Grav Para Term Preterm Abortions TAB SAB Ect Mult Living                 Review of Systems  Constitutional: Negative for fever, chills, diaphoresis, activity change, appetite change and fatigue.  HENT: Negative for congestion, facial swelling, rhinorrhea and sore throat.   Eyes: Negative for photophobia and discharge.  Respiratory: Negative for cough, chest tightness and shortness of breath.   Cardiovascular: Negative for chest pain, palpitations and leg swelling.  Gastrointestinal: Negative for nausea, vomiting, abdominal pain and diarrhea.  Endocrine: Negative for polydipsia and polyuria.  Genitourinary: Negative for dysuria, frequency, difficulty urinating and pelvic pain.  Musculoskeletal: Negative for arthralgias, back pain, neck pain and  neck stiffness.  Skin: Negative for color change and wound.  Allergic/Immunologic: Negative for immunocompromised state.  Neurological: Positive for headaches. Negative for facial asymmetry, weakness and numbness.  Hematological: Does not bruise/bleed easily.  Psychiatric/Behavioral: Negative for confusion and agitation.    Allergies  Morphine sulfate; Penicillins; Aminophylline; Clarithromycin; Oxycodone hcl; Sulfamethoxazole; and Sulfonamide derivatives  Home Medications   No current outpatient prescriptions on file. BP 123/52  Pulse 87  Temp(Src) 97.9  F (36.6 C) (Oral)  Resp 19  Ht 5\' 1"  (1.549 m)  Wt 112 lb (50.803 kg)  BMI 21.17 kg/m2  SpO2 96% Physical Exam  Constitutional: She is oriented to person, place, and time. She appears well-developed and well-nourished. No distress.  HENT:  Head: Normocephalic. Head is with contusion and with laceration.    Mouth/Throat: No oropharyngeal exudate.  Very hard of hearing  Eyes: Pupils are equal, round, and reactive to light.  Neck: Normal range of motion. Neck supple.  Cardiovascular: Normal rate, regular rhythm and normal heart sounds.  Exam reveals no gallop and no friction rub.   No murmur heard. Pulmonary/Chest: Effort normal and breath sounds normal. No respiratory distress. She has no wheezes. She has no rales.  Abdominal: Soft. Bowel sounds are normal. She exhibits no distension and no mass. There is no tenderness. There is no rebound and no guarding.  Musculoskeletal: Normal range of motion. She exhibits no edema and no tenderness.  Neurological: She is alert and oriented to person, place, and time.  Skin: Skin is warm and dry.  Psychiatric: She has a normal mood and affect.    ED Course  LACERATION REPAIR Date/Time: 07/06/2013 10:40 AM Performed by: Toy Cookey, E Authorized by: Toy Cookey, E Consent: Verbal consent obtained. written consent not obtained. Risks and benefits: risks, benefits and alternatives were discussed Consent given by: patient Required items: required blood products, implants, devices, and special equipment available Patient identity confirmed: verbally with patient Time out: Immediately prior to procedure a "time out" was called to verify the correct patient, procedure, equipment, support staff and site/side marked as required. Body area: head/neck Location details: forehead Laceration length: 3 cm Tendon involvement: none Nerve involvement: none Vascular damage: no Anesthesia: local infiltration Local anesthetic: lidocaine 1% with  epinephrine Anesthetic total: 2 ml Patient sedated: no Preparation: Patient was prepped and draped in the usual sterile fashion. Irrigation solution: saline Debridement: none Degree of undermining: none Skin closure: 5-0 Prolene Number of sutures: 4 Technique: simple Approximation: close Approximation difficulty: simple Dressing: 4x4 sterile gauze and antibiotic ointment   (including critical care time) Labs Review Labs Reviewed  BASIC METABOLIC PANEL - Abnormal; Notable for the following:    BUN 26 (*)    Creatinine, Ser 1.70 (*)    GFR calc non Af Amer 24 (*)    GFR calc Af Amer 28 (*)    All other components within normal limits  CBC WITH DIFFERENTIAL - Abnormal; Notable for the following:    WBC 3.0 (*)    RBC 3.52 (*)    Hemoglobin 9.8 (*)    HCT 30.6 (*)    All other components within normal limits  URINALYSIS, ROUTINE W REFLEX MICROSCOPIC - Abnormal; Notable for the following:    APPearance CLOUDY (*)    Hgb urine dipstick SMALL (*)    Nitrite POSITIVE (*)    Leukocytes, UA LARGE (*)    All other components within normal limits  URINE MICROSCOPIC-ADD ON - Abnormal; Notable for the following:  Bacteria, UA MANY (*)    All other components within normal limits  MRSA PCR SCREENING  URINE CULTURE  TROPONIN I  PROTIME-INR   Imaging Review Ct Head Wo Contrast  07/06/2013   CLINICAL DATA:  History of trauma from a fall.  EXAM: CT HEAD WITHOUT CONTRAST  CT CERVICAL SPINE WITHOUT CONTRAST  TECHNIQUE: Multidetector CT imaging of the head and cervical spine was performed following the standard protocol without intravenous contrast. Multiplanar CT image reconstructions of the cervical spine were also generated.  COMPARISON:  Head CT 11/06/2008.  FINDINGS: CT HEAD FINDINGS  Image 12 of series 6 demonstrates a small amount of extra-axial high attenuation material in the right frontal region, compatible with a small amount of subdural hemorrhage. There is also a small focus of  high attenuation in the subarachnoid space in the right frontal region on image 17 of series 6. No intraventricular hemorrhage identified at this time. No hydrocephalus. No intraparenchymal hemorrhage. Moderate cerebral and cerebellar atrophy. Patchy and confluent areas of decreased attenuation are noted throughout the deep and periventricular white matter of the cerebral hemispheres bilaterally, compatible with chronic microvascular ischemic disease. No mass effect and midline shift. No acute displaced skull fractures are identified. Visualized paranasal sinuses and mastoids are well pneumatized. Small amount of soft tissue swelling in the right frontal scalp and periorbital region.  CT CERVICAL SPINE FINDINGS  No acute displaced fractures of the cervical spine. There is severe multilevel degenerative disc disease throughout the cervical spine, most pronounced at C5-C6 and C6-C7. Severe multilevel facet arthropathy. Prevertebral soft tissues are normal. Visualized portions of the upper thorax are unremarkable.  IMPRESSION: 1. Right frontal scalp and periorbital contusion with underlying subdural hematoma in the right frontal region, and small amount of subarachnoid blood in the right frontal region as well. 2. Negative for skull fracture. 3. No evidence of significant acute traumatic injury to the cervical spine. 4. Moderate cerebral and cerebellar atrophy with chronic microvascular ischemic changes in the cerebral white matter, as above. 5. Severe multilevel degenerative disc disease and cervical spondylosis.   Electronically Signed   By: Trudie Reed M.D.   On: 07/06/2013 08:00   Ct Cervical Spine Wo Contrast  07/06/2013   CLINICAL DATA:  History of trauma from a fall.  EXAM: CT HEAD WITHOUT CONTRAST  CT CERVICAL SPINE WITHOUT CONTRAST  TECHNIQUE: Multidetector CT imaging of the head and cervical spine was performed following the standard protocol without intravenous contrast. Multiplanar CT image  reconstructions of the cervical spine were also generated.  COMPARISON:  Head CT 11/06/2008.  FINDINGS: CT HEAD FINDINGS  Image 12 of series 6 demonstrates a small amount of extra-axial high attenuation material in the right frontal region, compatible with a small amount of subdural hemorrhage. There is also a small focus of high attenuation in the subarachnoid space in the right frontal region on image 17 of series 6. No intraventricular hemorrhage identified at this time. No hydrocephalus. No intraparenchymal hemorrhage. Moderate cerebral and cerebellar atrophy. Patchy and confluent areas of decreased attenuation are noted throughout the deep and periventricular white matter of the cerebral hemispheres bilaterally, compatible with chronic microvascular ischemic disease. No mass effect and midline shift. No acute displaced skull fractures are identified. Visualized paranasal sinuses and mastoids are well pneumatized. Small amount of soft tissue swelling in the right frontal scalp and periorbital region.  CT CERVICAL SPINE FINDINGS  No acute displaced fractures of the cervical spine. There is severe multilevel degenerative disc disease throughout the  cervical spine, most pronounced at C5-C6 and C6-C7. Severe multilevel facet arthropathy. Prevertebral soft tissues are normal. Visualized portions of the upper thorax are unremarkable.  IMPRESSION: 1. Right frontal scalp and periorbital contusion with underlying subdural hematoma in the right frontal region, and small amount of subarachnoid blood in the right frontal region as well. 2. Negative for skull fracture. 3. No evidence of significant acute traumatic injury to the cervical spine. 4. Moderate cerebral and cerebellar atrophy with chronic microvascular ischemic changes in the cerebral white matter, as above. 5. Severe multilevel degenerative disc disease and cervical spondylosis.   Electronically Signed   By: Trudie Reed M.D.   On: 07/06/2013 08:00    EKG  Interpretation     Ventricular Rate:  90 PR Interval:    QRS Duration: 144 QT Interval:  406 QTC Calculation: 496 R Axis:   -78 Text Interpretation:  Atrial fibrillation Right bundle branch block Left anterior fascicular block Acute bifasicular block Abnormal ECG No prior available            MDM   1. Fall at nursing home, initial encounter   2. Facial laceration, initial encounter   3. SDH (subdural hematoma)   4. SAH (subarachnoid hemorrhage)   5. UTI (lower urinary tract infection)   6. Acute kidney injury   7. Anemia   8. Cognitive impairment    Pt is a 79 y.o. female with Pmhx as above who presents with unwittnessed fall from [facility] place this morning in her closet. Pt does nto know why she fell, states she got dizzy but is unable to elaborate.  She complains of pain over area of R facial lac, denies pain elsewhere.  No other signs of trauma. Focal neuro findings.  Pt on ASA. CT head, c-spine shows small R frontal SDH, small R frontal SAH, CBC, BMP, UA show acute kidney injury, worsening of baseline Hb, and UTI. EKG Afib & bifasicular block.  No prior and no known hx of arrythmia.  [facility] consulted, do not feel surgical intervention needed at this time, would rec repeat CT head tomorrow if family wanted to intervene.  [facility] consulted, and will admit to step down unit.  IV cipro given.  Laceration repaired.         Shanna Cisco, MD 07/06/13 [phone]

## 2013-07-06 NOTE — ED Notes (Signed)
edp at bedside suturing

## 2013-07-06 NOTE — Progress Notes (Signed)
Pt refuses to wear her SCD's

## 2013-07-06 NOTE — Consult Note (Signed)
NEURO HOSPITALIST CONSULT NOTE    Reason for Consult: SDH and SAH  HPI:                                                                                                                                         Majority of history obtained from chart due to patient having decreased cognition and sever decreased hearing.  Per daughter patient did wear hearing aids but daughter lost one in the washing machine and the second broke.   [name] is an 79 y.o. 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 She was found by nursing staff members undressed in the closet floor.  She was transferred to [facility] for further evaluation. She had a CT scan of head without contrast which showedsmall amount of extra-axial high attenuation material in the right frontal region, compatible with a small amount of subdural hemorrhage along with small focus of high attenuation in the subarachnoid space in the right frontal Region. No hydrocephalus or midline shift was noted. PAtient was transferred to [facility] hospital where further lab work showed a UTI and elevated BUN Cr. Hg was 9.8 and HCT was 30.6. Blood pressure has been normotensive. Telemetry and 12 lead shows Afib.   Her daughter states that there has been discussion amongst family members regarding goals of care, and they would not want patient to undergo surgical intervention. Her daughter does agree with IV fluids and IV antimicrobial therapy.     Past Medical History  Diagnosis Date  . Asthma   . Depression   . Diverticulosis   . Polymyalgia rheumatica 2008  . Depression   . Osteoarthritis   . Retinal detachment     left eye S/P silicone injection  . Myocardial infarct   . Pneumonia 11/2011    treated as OP  . Breast cancer 1987     Past Surgical History  Procedure Laterality Date  . Cesarean section      x3  . Cholecystectomy    . Biopsy thyroid  04-2004    needle  . Mrsa cellulitis  02-2006  . Shoulder surgery  11-2003  . Colonoscopy w/ polypectomy    . Partial gastrectomy  1977    [place], [place]  . Double mastectomy  1987    no post op therapy    Family History  Problem Relation Age of Onset  . Hypertension Mother   . Cancer Sister     intestinal  . Breast cancer Daughter   . Diabetes Neg Hx   . Stroke Neg Hx     Social  History:  reports that she has never smoked. She does not have any smokeless tobacco history on file. She reports that she does not drink alcohol or use illicit drugs.  Allergies  Allergen Reactions  . Morphine Sulfate     REACTION:difficulty breathing  . Penicillins     REACTION: difficulty breathing  . Aminophylline   . Clarithromycin     REACTION: unspecified  . Oxycodone Hcl     REACTION: unspecified  . Sulfamethoxazole     REACTION: unspecified  . Sulfonamide Derivatives     REACTION: unspecified    MEDICATIONS:                                                                                                                     Prior to Admission:  Prescriptions prior to admission  Medication Sig Dispense Refill  . ferrous sulfate 325 (65 FE) MG EC tablet Take 325 mg by mouth 3 (three) times daily with meals.      . furosemide (LASIX) 20 MG tablet Take 10 mg by mouth.      Marland Kitchen omeprazole (PRILOSEC) 20 MG capsule Take 20 mg by mouth daily.      Marland Kitchen aspirin 81 MG EC tablet Take 81 mg by mouth daily.        Marland Kitchen FLUoxetine (PROZAC) 10 MG capsule Take by mouth daily. 3 tab daily       . HYDROcodone-acetaminophen (NORCO) 7.5-325 MG per tablet Take 1 tablet by mouth every 4 (four) hours as needed.        . lidocaine (LIDODERM) 5 % Place 1 patch onto the skin as needed. Remove & Discard patch within 12 hours or as directed by MD       . LORazepam (ATIVAN) 0.5 MG tablet 1 by  mouth every 8-12 hours as needed ONLY  30 tablet  0  . methadone (DOLOPHINE) 5 MG tablet Take 5 mg by mouth. 1 am, 1 pm       . traZODone (DESYREL) 100 MG tablet Take 50 mg by mouth at bedtime. Rx'ed by Dr.Phillips       Scheduled: . methadone  5 mg Oral Daily  . ondansetron      . pantoprazole  40 mg Oral Daily  . sodium chloride  3 mL Intravenous Q12H     ROS:  History obtained from daughter  General ROS: negative for - chills, fatigue, fever, night sweats, weight gain or weight loss Psychological ROS: negative for - behavioral disorder, hallucinations, memory difficulties, mood swings or suicidal ideation Ophthalmic ROS: negative for - blurry vision, double vision, eye pain or loss of vision ENT ROS: negative for - epistaxis, nasal discharge, oral lesions, sore throat, tinnitus or vertigo Allergy and Immunology ROS: negative for - hives or itchy/watery eyes Hematological and Lymphatic ROS: negative for - bleeding problems, bruising or swollen lymph nodes Endocrine ROS: negative for - galactorrhea, hair pattern changes, polydipsia/polyuria or temperature intolerance Respiratory ROS: negative for - cough, hemoptysis, shortness of breath or wheezing Cardiovascular ROS: negative for - chest pain, dyspnea on exertion, edema or irregular heartbeat Gastrointestinal ROS: negative for - abdominal pain, diarrhea, hematemesis, nausea/vomiting or stool incontinence Genito-Urinary ROS: negative for - dysuria, hematuria, incontinence or urinary frequency/urgency Musculoskeletal ROS: negative for - joint swelling or muscular weakness Neurological ROS: as noted in HPI Dermatological ROS: negative for rash and skin lesion changes   Blood pressure 109/64, pulse 86, temperature 97.9 F (36.6 C), temperature source Oral, resp. rate 16, height 5\' 1"  (1.549 m), weight  50.803 kg (112 lb), SpO2 100.00%.   Neurologic Examination:                                                                                                       Mental Status: Alert, not oriented, very hard of hearing and has a difficult time understanding people due to her hearing deficit. Speech fluent without evidence of aphasia.  Able to follow simple commands, most by visual cues do to sever hearing deficit.  Cranial Nerves: II: Discs flat bilaterally; Visual fields grossly normal, pupils equal, round, reactive to light and accommodation III,IV, VI: ptosis not present, extra-ocular motions intact bilaterally V,VII: smile asymmetric on the right, facial light touch sensation normal bilaterally VIII: hearing severely decreased bilaterall IX,X: gag reflex present XI: bilateral shoulder shrug XII: midline tongue extension without atrophy or fasciculations  Motor: Right : Upper extremity   5/5    Left:     Upper extremity   5/5  Lower extremity   5/5     Lower extremity   5/5 Tone and bulk:normal tone throughout; no atrophy noted Sensory: Pinprick and light touch intact throughout, bilaterally Deep Tendon Reflexes:  Right: Upper Extremity   Left: Upper extremity   biceps (C-5 to C-6) 2/4   biceps (C-5 to C-6) 2/4 tricep (C7) 2/4    triceps (C7) 2/4 Brachioradialis (C6) 2/4  Brachioradialis (C6) 2/4  Lower Extremity Lower Extremity  quadriceps (L-2 to L-4) 2/4   quadriceps (L-2 to L-4) 2/4 Achilles (S1) 0/4   Achilles (S1) 0/4  Plantars: Right: downgoing   Left: downgoing Cerebellar: normal finger-to-nose,  normal heel-to-shin test Gait: not tested CV: pulses palpable throughout   Lab Results  Component Value Date/Time   CHOL 189 04/27/2012 11:52 AM    Results for orders placed during the hospital encounter of 07/06/13 (from the past 48 hour(s))  BASIC METABOLIC PANEL  Status: Abnormal   Collection Time    07/06/13  7:50 AM      Result Value Range   Sodium 135  135  - 145 mEq/L   Potassium 4.6  3.5 - 5.1 mEq/L   Chloride 100  96 - 112 mEq/L   CO2 26  19 - 32 mEq/L   Glucose, Bld 96  70 - 99 mg/dL   BUN 26 (*) 6 - 23 mg/dL   Creatinine, Ser 0.86 (*) 0.50 - 1.10 mg/dL   Calcium 9.2  8.4 - 57.8 mg/dL   GFR calc non Af Amer 24 (*) >90 mL/min   GFR calc Af Amer 28 (*) >90 mL/min   Comment: (NOTE)     The eGFR has been calculated using the CKD EPI equation.     This calculation has not been validated in all clinical situations.     eGFR's persistently <90 mL/min signify possible Chronic Kidney     Disease.  CBC WITH DIFFERENTIAL     Status: Abnormal   Collection Time    07/06/13  7:50 AM      Result Value Range   WBC 3.0 (*) 4.0 - 10.5 K/uL   RBC 3.52 (*) 3.87 - 5.11 MIL/uL   Hemoglobin 9.8 (*) 12.0 - 15.0 g/dL   HCT 46.9 (*) 62.9 - 52.8 %   MCV 86.9  78.0 - 100.0 fL   MCH 27.8  26.0 - 34.0 pg   MCHC 32.0  30.0 - 36.0 g/dL   RDW 41.3  24.4 - 01.0 %   Platelets 185  150 - 400 K/uL   Neutrophils Relative % 60  43 - 77 %   Neutro Abs 1.8  1.7 - 7.7 K/uL   Lymphocytes Relative 25  12 - 46 %   Lymphs Abs 0.8  0.7 - 4.0 K/uL   Monocytes Relative 12  3 - 12 %   Monocytes Absolute 0.4  0.1 - 1.0 K/uL   Eosinophils Relative 2  0 - 5 %   Eosinophils Absolute 0.1  0.0 - 0.7 K/uL   Basophils Relative 1  0 - 1 %   Basophils Absolute 0.0  0.0 - 0.1 K/uL  TROPONIN I     Status: None   Collection Time    07/06/13  7:50 AM      Result Value Range   Troponin I <0.30  <0.30 ng/mL   Comment:            Due to the release kinetics of cTnI,     a negative result within the first hours     of the onset of symptoms does not rule out     myocardial infarction with certainty.     If myocardial infarction is still suspected,     repeat the test at appropriate intervals.  PROTIME-INR     Status: None   Collection Time    07/06/13  7:50 AM      Result Value Range   Prothrombin Time 13.6  11.6 - 15.2 seconds   INR 1.06  0.00 - 1.49  URINALYSIS, ROUTINE W  REFLEX MICROSCOPIC     Status: Abnormal   Collection Time    07/06/13  8:28 AM      Result Value Range   Color, Urine YELLOW  YELLOW   APPearance CLOUDY (*) CLEAR   Specific Gravity, Urine 1.011  1.005 - 1.030   pH 6.5  5.0 - 8.0   Glucose, UA NEGATIVE  NEGATIVE mg/dL   Hgb urine dipstick SMALL (*) NEGATIVE   Bilirubin Urine NEGATIVE  NEGATIVE   Ketones, ur NEGATIVE  NEGATIVE mg/dL   Protein, ur NEGATIVE  NEGATIVE mg/dL   Urobilinogen, UA 0.2  0.0 - 1.0 mg/dL   Nitrite POSITIVE (*) NEGATIVE   Leukocytes, UA LARGE (*) NEGATIVE  URINE MICROSCOPIC-ADD ON     Status: Abnormal   Collection Time    07/06/13  8:28 AM      Result Value Range   WBC, UA TOO NUMEROUS TO COUNT  <3 WBC/hpf   RBC / HPF 0-2  <3 RBC/hpf   Bacteria, UA MANY (*) RARE  MRSA PCR SCREENING     Status: None   Collection Time    07/06/13 12:50 PM      Result Value Range   MRSA by PCR NEGATIVE  NEGATIVE   Comment:            The GeneXpert MRSA Assay (FDA     approved for NASAL specimens     only), is one component of a     comprehensive MRSA colonization     surveillance program. It is not     intended to diagnose MRSA     infection nor to guide or     monitor treatment for     MRSA infections.    Ct Head Wo Contrast  07/06/2013   CLINICAL DATA:  History of trauma from a fall.  EXAM: CT HEAD WITHOUT CONTRAST  CT CERVICAL SPINE WITHOUT CONTRAST  TECHNIQUE: Multidetector CT imaging of the head and cervical spine was performed following the standard protocol without intravenous contrast. Multiplanar CT image reconstructions of the cervical spine were also generated.  COMPARISON:  Head CT 11/06/2008.  FINDINGS: CT HEAD FINDINGS  Image 12 of series 6 demonstrates a small amount of extra-axial high attenuation material in the right frontal region, compatible with a small amount of subdural hemorrhage. There is also a small focus of high attenuation in the subarachnoid space in the right frontal region on image 17 of  series 6. No intraventricular hemorrhage identified at this time. No hydrocephalus. No intraparenchymal hemorrhage. Moderate cerebral and cerebellar atrophy. Patchy and confluent areas of decreased attenuation are noted throughout the deep and periventricular white matter of the cerebral hemispheres bilaterally, compatible with chronic microvascular ischemic disease. No mass effect and midline shift. No acute displaced skull fractures are identified. Visualized paranasal sinuses and mastoids are well pneumatized. Small amount of soft tissue swelling in the right frontal scalp and periorbital region.  CT CERVICAL SPINE FINDINGS  No acute displaced fractures of the cervical spine. There is severe multilevel degenerative disc disease throughout the cervical spine, most pronounced at C5-C6 and C6-C7. Severe multilevel facet arthropathy. Prevertebral soft tissues are normal. Visualized portions of the upper thorax are unremarkable.  IMPRESSION: 1. Right frontal scalp and periorbital contusion with underlying subdural hematoma in the right frontal region, and small amount of subarachnoid blood in the right frontal region as well. 2. Negative for skull fracture. 3. No evidence of significant acute traumatic injury to the cervical spine. 4. Moderate cerebral and cerebellar atrophy with chronic microvascular ischemic changes in the cerebral white matter, as above. 5. Severe multilevel degenerative disc disease and cervical spondylosis.   Electronically Signed   By: Trudie Reed M.D.   On: 07/06/2013 08:00   Ct Cervical Spine Wo Contrast  07/06/2013   CLINICAL DATA:  History of trauma from a fall.  EXAM: CT HEAD  WITHOUT CONTRAST  CT CERVICAL SPINE WITHOUT CONTRAST  TECHNIQUE: Multidetector CT imaging of the head and cervical spine was performed following the standard protocol without intravenous contrast. Multiplanar CT image reconstructions of the cervical spine were also generated.  COMPARISON:  Head CT 11/06/2008.   FINDINGS: CT HEAD FINDINGS  Image 12 of series 6 demonstrates a small amount of extra-axial high attenuation material in the right frontal region, compatible with a small amount of subdural hemorrhage. There is also a small focus of high attenuation in the subarachnoid space in the right frontal region on image 17 of series 6. No intraventricular hemorrhage identified at this time. No hydrocephalus. No intraparenchymal hemorrhage. Moderate cerebral and cerebellar atrophy. Patchy and confluent areas of decreased attenuation are noted throughout the deep and periventricular white matter of the cerebral hemispheres bilaterally, compatible with chronic microvascular ischemic disease. No mass effect and midline shift. No acute displaced skull fractures are identified. Visualized paranasal sinuses and mastoids are well pneumatized. Small amount of soft tissue swelling in the right frontal scalp and periorbital region.  CT CERVICAL SPINE FINDINGS  No acute displaced fractures of the cervical spine. There is severe multilevel degenerative disc disease throughout the cervical spine, most pronounced at C5-C6 and C6-C7. Severe multilevel facet arthropathy. Prevertebral soft tissues are normal. Visualized portions of the upper thorax are unremarkable.  IMPRESSION: 1. Right frontal scalp and periorbital contusion with underlying subdural hematoma in the right frontal region, and small amount of subarachnoid blood in the right frontal region as well. 2. Negative for skull fracture. 3. No evidence of significant acute traumatic injury to the cervical spine. 4. Moderate cerebral and cerebellar atrophy with chronic microvascular ischemic changes in the cerebral white matter, as above. 5. Severe multilevel degenerative disc disease and cervical spondylosis.   Electronically Signed   By: Trudie Reed M.D.   On: 07/06/2013 08:00   Assessment and plan discussed with with attending physician and they are in agreement.    Felicie Morn PA-C [facility] [phone]  07/06/2013, 3:03 PM   Patient seen and examined.  Clinical course and management discussed.  Necessary edits performed.  I agree with the above.  Assessment and plan of care developed and discussed below.    Assessment/Plan: 79 year old female s/p a fall at her assisted living facility today.  CT of the head has been reviewed and shows a small right frontal SDH with a small amount of subdural hemorrhage as well.  The patient has a history of dementia and is DNR.  Currently hemorrhage is small and would not be addressed surgically but even if hemorrhage increases family would not wish surgical intervention. It is therefore most appropriate to mange the patient conservatively.   With hospitalization and head trauma patient may not remain at baseline cognitively.  Supportive management most appropriate.  As she returns to her previous living situation and medical issues addressed she may more completely achieve her baseline.    Recommendations: 1.  PT 2.  Hold all antiplatelets and anticoagulants.  May restart ASA in 3-4 weeks after repeat imaging performed to confirm resolution of hemorrhages.   3.  Agree with addressing medical issues   Thana Farr, MD [facility] [phone]  07/06/2013  4:20 PM

## 2013-07-06 NOTE — Care Management Note (Signed)
    Page 1 of 1   07/06/2013     1:04:27 PM   CARE MANAGEMENT NOTE 07/06/2013  Patient:  [name], [name]   Account Number:  [id number]  Date Initiated:  07/06/2013  Documentation initiated by:  Junius Creamer  Subjective/Objective Assessment:   adm w uti, fall     Action/Plan:   from nsg facility   Anticipated DC Date:     Anticipated DC Plan:    In-house referral  Clinical Social Worker      DC Planning Services  CM consult      Choice offered to / List presented to:             Status of service:   Medicare Important Message given?   (If response is "NO", the following Medicare IM given date fields will be blank) Date Medicare IM given:   Date Additional Medicare IM given:    Discharge Disposition:    Per UR Regulation:  Reviewed for med. necessity/level of care/duration of stay  If discussed at Long Length of Stay Meetings, dates discussed:    Comments:

## 2013-07-06 NOTE — H&P (Signed)
[facility] History and Physical  [name] GNF:[id number] DOB: 1915-10-01 DOA: 07/06/2013  Referring physician:  PCP: Marga Melnick, MD  Specialists:   Chief Complaint: Status post fall  HPI: [name] is a 79 y.o. 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 She denied any symptoms prior to the fall, was found by nursing staff members undressed in the closet floor. Patient noted to have laceration to right temporal region. She was transferred to [facility] for further evaluation. She had a CT scan of head without contrast which showed underlying subdural hematoma in the right frontal region as well as a small amount of subarachnoid blood in the right frontal region. She was transferred to the step down unit at [facility] for further evaluation and treatment. Lab work was also significant for acute kidney injury as well as urinary tract infection. She was administered a dose of ciprofloxacin at [facility]. Her daughter states that there has been discussion amongst family members regarding goals of care, and they would not want patient to undergo surgical intervention. Her daughter does agree with IV fluids and IV antimicrobial therapy. I have discussed case with Dr. Thad Ranger of neurology who will consult.                 Review of Systems: The patient denies anorexia, fever, weight loss,, vision loss, decreased hearing, hoarseness, chest pain, syncope, dyspnea on exertion, peripheral edema, balance deficits, hemoptysis, abdominal pain, melena, hematochezia, severe indigestion/heartburn, hematuria, incontinence, genital sores, muscle weakness, suspicious skin lesions, transient blindness, depression, unusual weight change, abnormal bleeding, enlarged lymph nodes,  angioedema, and breast masses.    Past Medical History  Diagnosis Date  . Asthma   . Depression   . Diverticulosis   . Polymyalgia rheumatica 2008  . Depression   . Osteoarthritis   . Retinal detachment     left eye S/P silicone injection  . Myocardial infarct   . Pneumonia 11/2011    treated as OP  . Breast cancer 1987   Past Surgical History  Procedure Laterality Date  . Cesarean section      x3  . Cholecystectomy    . Biopsy thyroid  04-2004    needle  . Mrsa cellulitis  02-2006  . Shoulder surgery  11-2003  . Colonoscopy w/ polypectomy    . Partial gastrectomy  1977    [place], [place]  . Double mastectomy  1987    no post op therapy   Social History:  reports that she has never smoked. She does not have any smokeless tobacco history on file. She reports that she does not drink alcohol or use illicit drugs. Patient is a resident at an assisted living facility, she does not drink or smoke  Allergies  Allergen Reactions  . Morphine Sulfate     REACTION:difficulty breathing  . Penicillins     REACTION: difficulty breathing  . Aminophylline   . Clarithromycin     REACTION: unspecified  . Oxycodone Hcl     REACTION: unspecified  . Sulfamethoxazole     REACTION: unspecified  . Sulfonamide Derivatives     REACTION: unspecified    Family History  Problem Relation Age of Onset  . Hypertension Mother   . Cancer Sister  intestinal  . Breast cancer Daughter   . Diabetes Neg Hx   . Stroke Neg Hx     Prior to Admission medications   Medication Sig Start Date End Date Taking? Authorizing Provider  ferrous sulfate 325 (65 FE) MG EC tablet Take 325 mg by mouth 3 (three) times daily with meals.   Yes Historical Provider, MD  furosemide (LASIX) 20 MG tablet Take 10 mg by mouth.   Yes Historical Provider, MD  omeprazole (PRILOSEC) 20 MG capsule Take 20 mg by mouth daily.   Yes Historical Provider, MD  aspirin 81 MG EC tablet Take 81 mg by mouth daily.      Historical  Provider, MD  FLUoxetine (PROZAC) 10 MG capsule Take by mouth daily. 3 tab daily     Historical Provider, MD  HYDROcodone-acetaminophen (NORCO) 7.5-325 MG per tablet Take 1 tablet by mouth every 4 (four) hours as needed.      Historical Provider, MD  lidocaine (LIDODERM) 5 % Place 1 patch onto the skin as needed. Remove & Discard patch within 12 hours or as directed by MD     Historical Provider, MD  LORazepam (ATIVAN) 0.5 MG tablet 1 by mouth every 8-12 hours as needed ONLY 12/16/12   Pecola Lawless, MD  methadone (DOLOPHINE) 5 MG tablet Take 5 mg by mouth. 1 am, 1 pm     Historical Provider, MD  traZODone (DESYREL) 100 MG tablet Take 50 mg by mouth at bedtime. Rx'ed by Dr.Phillips    Historical Provider, MD   Physical Exam: Filed Vitals:   07/06/13 1300  BP: 109/64  Pulse: 86  Temp:   Resp: 16     General:  She is in no acute distress, daughter at bedside confirms that she is at her baseline level.  Eyes: Laceration over right frontal temporal region. Pupils are equal round and reactive to light extraocular movement is intact no sclera icterus  Neck: Supple symmetrical no jugular venous distention  Cardiovascular: Regular rate and rhythm, 2/6 systolic ejection murmur, positive edema to lower extremities.  Respiratory: Normal respiratory effort, lungs are clear to auscultation bilaterally  Abdomen: Soft nontender nondistended positive bowel sound  Skin: Laceration and hematoma over the frontal temporal region noted.  Musculoskeletal: Preserved range of motion to all extremities, positive edema  Psychiatric: Patient is disoriented however awake and alert, pleasant cooperative following commands  Neurologic: She has a nonfocal neurologic exam. She is awake and alert. No facial droop or tongue deviation. She is able to follow commands. Has 5 out of 5 muscle strength upper and lower extremities, 2+ bilateral deep tendon reflexes  Labs on Admission:  Basic Metabolic  Panel:  Recent Labs Lab 07/06/13 0750  NA 135  K 4.6  CL 100  CO2 26  GLUCOSE 96  BUN 26*  CREATININE 1.70*  CALCIUM 9.2   Liver Function Tests: No results found for this basename: AST, ALT, ALKPHOS, BILITOT, PROT, ALBUMIN,  in the last 168 hours No results found for this basename: LIPASE, AMYLASE,  in the last 168 hours No results found for this basename: AMMONIA,  in the last 168 hours CBC:  Recent Labs Lab 07/06/13 0750  WBC 3.0*  NEUTROABS 1.8  HGB 9.8*  HCT 30.6*  MCV 86.9  PLT 185   Cardiac Enzymes:  Recent Labs Lab 07/06/13 0750  TROPONINI <0.30    BNP (last 3 results) No results found for this basename: PROBNP,  in the last 8760 hours CBG: No results found  for this basename: GLUCAP,  in the last 168 hours  Radiological Exams on Admission: Ct Head Wo Contrast  07/06/2013   CLINICAL DATA:  History of trauma from a fall.  EXAM: CT HEAD WITHOUT CONTRAST  CT CERVICAL SPINE WITHOUT CONTRAST  TECHNIQUE: Multidetector CT imaging of the head and cervical spine was performed following the standard protocol without intravenous contrast. Multiplanar CT image reconstructions of the cervical spine were also generated.  COMPARISON:  Head CT 11/06/2008.  FINDINGS: CT HEAD FINDINGS  Image 12 of series 6 demonstrates a small amount of extra-axial high attenuation material in the right frontal region, compatible with a small amount of subdural hemorrhage. There is also a small focus of high attenuation in the subarachnoid space in the right frontal region on image 17 of series 6. No intraventricular hemorrhage identified at this time. No hydrocephalus. No intraparenchymal hemorrhage. Moderate cerebral and cerebellar atrophy. Patchy and confluent areas of decreased attenuation are noted throughout the deep and periventricular white matter of the cerebral hemispheres bilaterally, compatible with chronic microvascular ischemic disease. No mass effect and midline shift. No acute  displaced skull fractures are identified. Visualized paranasal sinuses and mastoids are well pneumatized. Small amount of soft tissue swelling in the right frontal scalp and periorbital region.  CT CERVICAL SPINE FINDINGS  No acute displaced fractures of the cervical spine. There is severe multilevel degenerative disc disease throughout the cervical spine, most pronounced at C5-C6 and C6-C7. Severe multilevel facet arthropathy. Prevertebral soft tissues are normal. Visualized portions of the upper thorax are unremarkable.  IMPRESSION: 1. Right frontal scalp and periorbital contusion with underlying subdural hematoma in the right frontal region, and small amount of subarachnoid blood in the right frontal region as well. 2. Negative for skull fracture. 3. No evidence of significant acute traumatic injury to the cervical spine. 4. Moderate cerebral and cerebellar atrophy with chronic microvascular ischemic changes in the cerebral white matter, as above. 5. Severe multilevel degenerative disc disease and cervical spondylosis.   Electronically Signed   By: Trudie Reed M.D.   On: 07/06/2013 08:00   Ct Cervical Spine Wo Contrast  07/06/2013   CLINICAL DATA:  History of trauma from a fall.  EXAM: CT HEAD WITHOUT CONTRAST  CT CERVICAL SPINE WITHOUT CONTRAST  TECHNIQUE: Multidetector CT imaging of the head and cervical spine was performed following the standard protocol without intravenous contrast. Multiplanar CT image reconstructions of the cervical spine were also generated.  COMPARISON:  Head CT 11/06/2008.  FINDINGS: CT HEAD FINDINGS  Image 12 of series 6 demonstrates a small amount of extra-axial high attenuation material in the right frontal region, compatible with a small amount of subdural hemorrhage. There is also a small focus of high attenuation in the subarachnoid space in the right frontal region on image 17 of series 6. No intraventricular hemorrhage identified at this time. No hydrocephalus. No  intraparenchymal hemorrhage. Moderate cerebral and cerebellar atrophy. Patchy and confluent areas of decreased attenuation are noted throughout the deep and periventricular white matter of the cerebral hemispheres bilaterally, compatible with chronic microvascular ischemic disease. No mass effect and midline shift. No acute displaced skull fractures are identified. Visualized paranasal sinuses and mastoids are well pneumatized. Small amount of soft tissue swelling in the right frontal scalp and periorbital region.  CT CERVICAL SPINE FINDINGS  No acute displaced fractures of the cervical spine. There is severe multilevel degenerative disc disease throughout the cervical spine, most pronounced at C5-C6 and C6-C7. Severe multilevel facet arthropathy. Prevertebral soft  tissues are normal. Visualized portions of the upper thorax are unremarkable.  IMPRESSION: 1. Right frontal scalp and periorbital contusion with underlying subdural hematoma in the right frontal region, and small amount of subarachnoid blood in the right frontal region as well. 2. Negative for skull fracture. 3. No evidence of significant acute traumatic injury to the cervical spine. 4. Moderate cerebral and cerebellar atrophy with chronic microvascular ischemic changes in the cerebral white matter, as above. 5. Severe multilevel degenerative disc disease and cervical spondylosis.   Electronically Signed   By: Trudie Reed M.D.   On: 07/06/2013 08:00    EKG: Independently reviewed. EKG showing atrial fibrillation, heart rate of 90  Assessment/Plan Active Problems:   SAH (subarachnoid hemorrhage)   SDH (subdural hematoma)   UTI (lower urinary tract infection)   Acute kidney injury   Fall at nursing home   Facial laceration   Cognitive impairment   1. Subdural hematoma/subarachnoid bleed. Patient having a fall earlier today which resulted in head trauma. CT scan at [facility] showing subdural hematoma in the right frontal  region with small amount of subarachnoid blood. Patient having nonfocal neurologic exam. I discussed case with Dr. Thad Ranger of neurology will consult. Discontinue aspirin therapy. Her daughter present at bedside reporting that family would not want patient to undergo surgery. Continue supportive care, neuro checks, IV fluid hydration. 2. Status post fall. Patient with a history of recurrent falls. I suspect multifactorial with multiple psychotropic medications, dehydration, and urinary tract infection all likely contributors to today's fall. Will try to minimize psychotropic medications, provide IV fluids and IV antibiotics. Physical therapy consult when stable.  3. Acute renal failure. Lab work showing a BUN and creatinine of 26 and 1.7. She had normal kidney function back on 04/27/2012 with a creatinine of 1.1. I suspect secondary to hypovolemia, patient on diuretic therapy. We'll discontinue Lasix, start IV fluid hydration with normal saline at 75 mL per hour. Repeat a.m. BMP. 4. Urinary tract infection. Patient having a positive UA. She has multiple antibiotic allergies, was administered ciprofloxacin at [facility] which she tolerated well. Will consult pharmacy for renal dosing of IV Cipro.  5. Dehydration. Discontinue diuretic, starting gentle IV fluid resuscitation.  6. Atrial fibrillation. Initial EKG showing A. fib with ventricular rates in the 90s. Patient not anticoagulation candidate. Will monitor heart rates today, consider adding a nodal agent.  7. Chronic pain syndrome. Patient is on both Norco and methadone. For now I will discontinue Norco continue methadone at 5 mg by mouth daily 8. DVT prophylaxis. SCDs    Code Status: DO NOT RESUSCITATE, confirmed by daughter present at bedside Family Communication: Plan discussed with daughter present at bedside Disposition Plan: Will admit patient to step down, I anticipate she'll require greater than 2 night hospitalization  Time spent:  61  Jeralyn Bennett [facility] Pager [phone]  If 7PM-7AM, please contact night-coverage [email or web address] Password [facility] 07/06/2013, 2:39 PM

## 2013-07-06 NOTE — Progress Notes (Signed)
ANTIBIOTIC CONSULT NOTE - INITIAL  Pharmacy Consult for cipro Indication: UTI  Allergies  Allergen Reactions  . Morphine Sulfate     REACTION:difficulty breathing  . Penicillins     REACTION: difficulty breathing  . Aminophylline   . Clarithromycin     REACTION: unspecified  . Oxycodone Hcl     REACTION: unspecified  . Sulfamethoxazole     REACTION: unspecified  . Sulfonamide Derivatives     REACTION: unspecified    Patient Measurements: Height: 5\' 1"  (154.9 cm) Weight: 112 lb (50.803 kg) IBW/kg (Calculated) : 47.8  Labs:  Recent Labs  07/06/13 0750  WBC 3.0*  HGB 9.8*  PLT 185  CREATININE 1.70*   Estimated Creatinine Clearance: 14.9 ml/min (by C-G formula based on Cr of 1.7). No results found for this basename: VANCOTROUGH, Leodis Binet, VANCORANDOM, GENTTROUGH, GENTPEAK, GENTRANDOM, TOBRATROUGH, TOBRAPEAK, TOBRARND, AMIKACINPEAK, AMIKACINTROU, AMIKACIN,  in the last 72 hours   Microbiology: Recent Results (from the past 720 hour(s))  MRSA PCR SCREENING     Status: None   Collection Time    07/06/13 12:50 PM      Result Value Range Status   MRSA by PCR NEGATIVE  NEGATIVE Final   Comment:            The GeneXpert MRSA Assay (FDA     approved for NASAL specimens     only), is one component of a     comprehensive MRSA colonization     surveillance program. It is not     intended to diagnose MRSA     infection nor to guide or     monitor treatment for     MRSA infections.    Medical History: Past Medical History  Diagnosis Date  . Asthma   . Depression   . Diverticulosis   . Polymyalgia rheumatica 2008  . Depression   . Osteoarthritis   . Retinal detachment     left eye S/P silicone injection  . Myocardial infarct   . Pneumonia 11/2011    treated as OP  . Breast cancer 1987   Assessment: 89 yof s/p fall with SDH/SAH. Patient also with acute renal failure and positive U/A. Cipro given x1 at [facility]. Will continue daily dosing of cipro while scr is  elevated. WBC low at 3.  Goal of Therapy:  Appropriate renal dosing of abx Eradication of infection  Plan:  Cipro 400mg  IV q24 hours - next dose 10/29 Follow C&S and renal function for adjustments  Sheppard Coil PharmD., BCPS Clinical Pharmacist Pager [phone] 07/06/2013 3:23 PM

## 2013-07-06 NOTE — ED Notes (Signed)
Per EMS:  Pt from [facility].  Pt fell in her closet this morning.  No LOC.  Pt ambulatory on EMS arrival.  Pt denies pain.  Noted to have a 1-2 inch lac on the (R) side of her face.  Bleeding controlled.

## 2013-07-07 LAB — CBC
HCT: 29 % — ABNORMAL LOW (ref 36.0–46.0)
Hemoglobin: 9.5 g/dL — ABNORMAL LOW (ref 12.0–15.0)
MCH: 28.6 pg (ref 26.0–34.0)
MCHC: 32.8 g/dL (ref 30.0–36.0)
Platelets: 176 10*3/uL (ref 150–400)
RDW: 14.2 % (ref 11.5–15.5)

## 2013-07-07 LAB — BASIC METABOLIC PANEL
BUN: 18 mg/dL (ref 6–23)
Calcium: 8.6 mg/dL (ref 8.4–10.5)
Chloride: 103 mEq/L (ref 96–112)
Creatinine, Ser: 1.32 mg/dL — ABNORMAL HIGH (ref 0.50–1.10)
GFR calc Af Amer: 38 mL/min — ABNORMAL LOW (ref >90)
GFR calc non Af Amer: 33 mL/min — ABNORMAL LOW (ref >90)
Glucose, Bld: 89 mg/dL (ref 70–99)

## 2013-07-07 MED ORDER — LORAZEPAM 0.5 MG PO TABS
0.5000 mg | ORAL_TABLET | Freq: Four times a day (QID) | ORAL | Status: DC | PRN
  Administered 2013-07-07 – 2013-07-09 (×7): 0.5 mg via ORAL
  Filled 2013-07-07 (×7): qty 1

## 2013-07-07 MED ORDER — LORAZEPAM 0.5 MG PO TABS
0.5000 mg | ORAL_TABLET | ORAL | Status: DC
  Administered 2013-07-07 – 2013-07-09 (×3): 0.5 mg via ORAL
  Filled 2013-07-07 (×3): qty 1

## 2013-07-07 MED ORDER — FLUOXETINE HCL 20 MG PO CAPS
30.0000 mg | ORAL_CAPSULE | Freq: Every day | ORAL | Status: DC
  Administered 2013-07-07 – 2013-07-09 (×3): 30 mg via ORAL
  Filled 2013-07-07 (×4): qty 1

## 2013-07-07 MED ORDER — FERROUS SULFATE 325 (65 FE) MG PO TABS
325.0000 mg | ORAL_TABLET | Freq: Every morning | ORAL | Status: DC
  Administered 2013-07-08 – 2013-07-09 (×2): 325 mg via ORAL
  Filled 2013-07-07 (×2): qty 1

## 2013-07-07 MED ORDER — INFLUENZA VAC SPLIT QUAD 0.5 ML IM SUSP
0.5000 mL | INTRAMUSCULAR | Status: AC
  Administered 2013-07-08: 0.5 mL via INTRAMUSCULAR
  Filled 2013-07-07: qty 0.5

## 2013-07-07 MED ORDER — CIPROFLOXACIN HCL 500 MG PO TABS
500.0000 mg | ORAL_TABLET | ORAL | Status: DC
  Administered 2013-07-08 – 2013-07-09 (×2): 500 mg via ORAL
  Filled 2013-07-07 (×3): qty 1

## 2013-07-07 NOTE — Progress Notes (Signed)
[facility] Progress Note [name] TEAM 1 - Stepdown ICU Team   [name] ZOX:[id number] DOB: February 09, 1916 DOA: 07/06/2013 PCP: Marga Melnick, MD  Brief narrative: 79 y.o. 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 She was in her usual state of health when she had a fall in the closet of her room at the assisted living facility. She denied any symptoms prior to the fall. She was found by nursing staff members undressed on the closet floor. Patient noted to have laceration to right temporal region. She was transferred to [facility] for further evaluation. She had a CT scan of head without contrast which showed underlying subdural hematoma in the right frontal region as well as a small amount of subarachnoid blood in the right frontal region. She then was transferred to the step down unit at [facility] for further evaluation and treatment. Lab work was also significant for acute kidney injury as well as urinary tract infection. She was administered a dose of ciprofloxacin at [facility]. Her daughter stated that there has been discussion amongst family members regarding goals of care, and they would not want patient to undergo surgical intervention. Her daughter did agree with IV fluids and IV antimicrobial therapy. The admitting MD discussed this case with Dr. Thad Ranger of neurology who will see in consult.   Assessment/Plan:    SAH (subarachnoid hemorrhage)/SDH (subdural hematoma) -appreciate Neurology assistance -agree with holding anti platelet meds for the next 3-4 weeks -FU CT head in AM to ensure stability     E. Coli UTI (lower urinary tract infection) -Urine cx with >100,000 e. Coli - sensitivities pendig -cont empiric Cipro/allergic to PCN -apparent recent UTI-med rec shows was on Bactrim x 10 days pre admit   Atrial fibrillation (appears to be new onset) -rate controlled -not an  anticoagulation candidate    Acute kidney injury -felt to be 2/2 DH-renal function improving -cont gentle IVF hydration -follow lytes -hold Lasix    Facial laceration -remove sutures in ~10 days    ANEMIA NOS -hgb stable    Fall at nursing home -PT/OT eval -may need sitter at bedside when family not available    Chronic pain/PMR -cont home Methadone to prevent withdrawal    Cognitive impairment -c/w dementia   DVT prophylaxis: SCDs Code Status: DO NOT RESUSCITATE Family Communication: Patient's daughter at bedside Disposition Plan/Expected LOS: Transfer to floor Isolation: Contact isolation for MRSA PCR positive status  Consultants: Neurology  Procedures: None  Antibiotics: Cipro 10/28 >>>  HPI/Subjective: Patient sleeping but easily arousable. Very hard of hearing so unable to contribute to history portion of exam. Daughter at bedside. Patient appears to be returning to baseline level of cognitive function.  Objective: Blood pressure 112/72, pulse 83, temperature 98.2 F (36.8 C), temperature source Oral, resp. rate 21, height 5\' 1"  (1.549 m), weight 50.803 kg (112 lb), SpO2 98.00%.  Intake/Output Summary (Last 24 hours) at 07/07/13 1225 Last data filed at 07/07/13 1100  Gross per 24 hour  Intake 1897.5 ml  Output    200 ml  Net 1697.5 ml   Exam: General: No acute respiratory distress ENT: Right periorbital and cheek ecchymosis Lungs: Clear to auscultation bilaterally without wheezes or crackles, RA Cardiovascular: Irregular rate and rhythm without murmur gallop or rub normal S1 and S2, no peripheral edema or JVD Abdomen: Nontender, nondistended, soft, bowel sounds positive, no rebound, no ascites, no appreciable mass Musculoskeletal: No  significant cyanosis, clubbing of bilateral lower extremities Neurological: Awake and oriented x name, moves all extremities x 4 without focal neurological deficits, CN 2-12 intact  Scheduled Meds:  Scheduled Meds: .  ciprofloxacin  400 mg Intravenous Q24H  . methadone  5 mg Oral Daily  . pantoprazole  40 mg Oral Daily  . sodium chloride  3 mL Intravenous Q12H    Data Reviewed: Basic Metabolic Panel:  Recent Labs Lab 07/06/13 0750 07/07/13 0406  NA 135 135  K 4.6 4.7  CL 100 103  CO2 26 24  GLUCOSE 96 89  BUN 26* 18  CREATININE 1.70* 1.32*  CALCIUM 9.2 8.6   Liver Function Tests: No results found for this basename: AST, ALT, ALKPHOS, BILITOT, PROT, ALBUMIN,  in the last 168 hours  CBC:  Recent Labs Lab 07/06/13 0750 07/07/13 0406  WBC 3.0* 2.9*  NEUTROABS 1.8  --   HGB 9.8* 9.5*  HCT 30.6* 29.0*  MCV 86.9 87.3  PLT 185 176   Cardiac Enzymes:  Recent Labs Lab 07/06/13 0750  TROPONINI <0.30     Recent Results (from the past 240 hour(s))  MRSA PCR SCREENING     Status: None   Collection Time    07/06/13 12:50 PM      Result Value Range Status   MRSA by PCR NEGATIVE  NEGATIVE Final   Comment:            The GeneXpert MRSA Assay (FDA     approved for NASAL specimens     only), is one component of a     comprehensive MRSA colonization     surveillance program. It is not     intended to diagnose MRSA     infection nor to guide or     monitor treatment for     MRSA infections.     Studies:  Recent x-ray studies have been reviewed in detail by the Attending Physician     Junious Silk, ANP [facility] Office  [phone] Pager [phone]  **If unable to reach the above provider after paging please contact the Flow Manager @ [phone]  On-Call/Text Page:      [email or web address]      password [email or web address]  If 7PM-7AM, please contact night-coverage [email or web address] Password [email or web address] 07/07/2013, 12:25 PM   LOS: 1 day   I have personally examined this patient and reviewed the entire database. I have reviewed the above note, made any necessary editorial changes, and agree with its content.  Lonia Blood, MD [facility]

## 2013-07-07 NOTE — Clinical Social Work Psychosocial (Signed)
Clinical Social Work Department BRIEF PSYCHOSOCIAL ASSESSMENT 07/07/2013  Patient:  [name], [name]     Account Number:  [id number]     Admit date:  07/06/2013  Clinical Social Worker:  Varney Biles  Date/Time:  07/07/2013 10:54 AM  Referred by:  Physician  Date Referred:  07/07/2013 Referred for  Other - See comment   Other Referral:   Referred because pt was from an ALF, but family would prefer she go to SNF for higher level of care due to recent falls. CSW now searching for SNF for pt.   Interview type:  Patient Other interview type:   CSW also spoke with pt's daughter [name], who was in the room with pt.    PSYCHOSOCIAL DATA Living Status:  FACILITY Admitted from facility:  [facility] [address] Level of care:  Assisted Living Primary support name:  [name] [phone]) Primary support relationship to patient:  CHILD, ADULT Degree of support available:   Good--pt's son, [name], is  her POA and is also very involved in pt's care ([phone]). [name]  lives in [place], but makes medical decisions for pt and found her ALF. Pt's daughter [name] visits her regularly and was visiting during CSW visit.    CURRENT CONCERNS Current Concerns  Post-Acute Placement   Other Concerns:    SOCIAL WORK ASSESSMENT / PLAN CSW introduced herself and asked if pt was from [facility]. Daughter [name] confirmed that pt is from this facility, but she and her brother [name], [place]) have decided that they want to move her to a SNF for more assistance as their mother has had some falls recently. CSW provided a list of SNFs and got permission to send pt's clinicals to all facilities in [facility]. CSW has called pt's nurse and requested PT/OT evaluation so CSW can send clinicals to facilities once this eval is completed. Nurse says she believes MD has already placed this order, but she will double check to be sure. CSW will send clinicals to facilities once pt has been  evaluated.   Assessment/plan status:  Psychosocial Support/Ongoing Assessment of Needs Other assessment/ plan:   Information/referral to community resources:   SNF.    PATIENT'S/FAMILY'S RESPONSE TO PLAN OF CARE: Pt and pt's daughter [name] were receptive to CSW visit and [name] expressed gratitude for SNF list. CSW has provided her phone number and invited [name] & her brother [name] to call.       Maryclare Labrador, MSW, [facility] Clinical Social Worker [phone]

## 2013-07-07 NOTE — Clinical Social Work Note (Signed)
CSW received a call from pt's son [name] [phone]). [name] lives in [place] and is pt's POA. [name] has the list of SNFs and CSW has invited him to call if he has questions or interest in any facilities. CSW will send pt's clinicals to all SNFs in [facility], per son & daughter Elnita Maxwell [phone]) request.

## 2013-07-08 ENCOUNTER — Inpatient Hospital Stay (HOSPITAL_COMMUNITY): Payer: Medicare Other

## 2013-07-08 DIAGNOSIS — IMO0001 Reserved for inherently not codable concepts without codable children: Secondary | ICD-10-CM | POA: Diagnosis present

## 2013-07-08 DIAGNOSIS — A498 Other bacterial infections of unspecified site: Secondary | ICD-10-CM

## 2013-07-08 DIAGNOSIS — I4891 Unspecified atrial fibrillation: Secondary | ICD-10-CM | POA: Diagnosis present

## 2013-07-08 DIAGNOSIS — D649 Anemia, unspecified: Secondary | ICD-10-CM

## 2013-07-08 DIAGNOSIS — H9190 Unspecified hearing loss, unspecified ear: Secondary | ICD-10-CM | POA: Diagnosis present

## 2013-07-08 DIAGNOSIS — N390 Urinary tract infection, site not specified: Secondary | ICD-10-CM

## 2013-07-08 DIAGNOSIS — F0391 Unspecified dementia with behavioral disturbance: Secondary | ICD-10-CM | POA: Diagnosis present

## 2013-07-08 DIAGNOSIS — F0390 Unspecified dementia without behavioral disturbance: Secondary | ICD-10-CM

## 2013-07-08 LAB — URINE CULTURE: Colony Count: >=100000

## 2013-07-08 LAB — BASIC METABOLIC PANEL
CO2: 23 mEq/L (ref 19–32)
Chloride: 102 mEq/L (ref 96–112)
Creatinine, Ser: 1.15 mg/dL — ABNORMAL HIGH (ref 0.50–1.10)
GFR calc Af Amer: 45 mL/min — ABNORMAL LOW (ref >90)
GFR calc non Af Amer: 39 mL/min — ABNORMAL LOW (ref >90)
Sodium: 134 mEq/L — ABNORMAL LOW (ref 135–145)

## 2013-07-08 LAB — CBC
MCV: 85.3 fL (ref 78.0–100.0)
Platelets: 162 10*3/uL (ref 150–400)
RBC: 3.12 MIL/uL — ABNORMAL LOW (ref 3.87–5.11)
RDW: 14 % (ref 11.5–15.5)
WBC: 2.7 10*3/uL — ABNORMAL LOW (ref 4.0–10.5)

## 2013-07-08 NOTE — Progress Notes (Signed)
Chart reviewed.  [facility] Progress Note   [name] OZH:[id number] DOB: 19-Jun-1916 DOA: 07/06/2013 PCP: Marga Melnick, MD  Brief narrative: 79 y.o. 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 She was in her usual state of health when she had a fall in the closet of her room at the assisted living facility. She denied any symptoms prior to the fall. She was found by nursing staff members undressed on the closet floor. Patient noted to have laceration to right temporal region. She was transferred to [facility] for further evaluation. She had a CT scan of head without contrast which showed underlying subdural hematoma in the right frontal region as well as a small amount of subarachnoid blood in the right frontal region. She then was transferred to the step down unit at [facility] for further evaluation and treatment. Lab work was also significant for acute kidney injury as well as urinary tract infection. She was administered a dose of ciprofloxacin at [facility]. Her daughter stated that there has been discussion amongst family members regarding goals of care, and they would not want patient to undergo surgical intervention. Her daughter did agree with IV fluids and IV antimicrobial therapy. The admitting MD discussed this case with Dr. Thad Ranger of neurology who will see in consult.   Assessment/Plan:    SAH (subarachnoid hemorrhage)/SDH (subdural hematoma) CT brain improved. Await PT/OT eval for dispo recs    E. Coli UTI (lower urinary tract infection) Sensitive to almost all. Continue current   Atrial fibrillation (appears to be new onset) -rate controlled -not an anticoagulation candidate    Acute kidney injury improving    Facial laceration -remove sutures in ~10 days    ANEMIA NOS -hgb stable    Fall at nursing home -PT/OT eval    Chronic pain/PMR -cont home Methadone  to prevent withdrawal    Cognitive impairment -c/w dementia   DVT prophylaxis: SCDs Code Status: DO NOT RESUSCITATE Family Communication: none today Disposition Plan/Expected LOS: ALF v. SNF Isolation: Contact isolation for MRSA PCR positive status  Consultants: Neurology  Procedures: None  Antibiotics: Cipro 10/28 >>>  HPI/Subjective: Patient sleeping but easily arousable. Very hard of hearing so unable to contribute to history portion of exam.   Objective: Blood pressure 147/75, pulse 86, temperature 97.8 F (36.6 C), temperature source Oral, resp. rate 19, height 5\' 1"  (1.549 m), weight 49.9 kg (110 lb 0.2 oz), SpO2 93.00%. No intake or output data in the 24 hours ending 07/08/13 1118 Exam: General: No acute respiratory distress ENT: Right periorbital and cheek ecchymosis Lungs: Clear to auscultation bilaterally without wheezes or crackles, RA Cardiovascular: Irregular rate and rhythm without murmur gallop or rub normal S1 and S2, no peripheral edema or JVD Abdomen: Nontender, nondistended, soft, bowel sounds positive, no rebound, no ascites, no appreciable mass Musculoskeletal: No significant cyanosis, clubbing of bilateral lower extremities Neurological: Awake and oriented x name, moves all extremities x 4 without focal neurological deficits, CN 2-12 intact  Scheduled Meds:  Scheduled Meds: . ciprofloxacin  500 mg Oral Q24H  . ferrous sulfate  325 mg Oral q morning - 10a  . FLUoxetine  30 mg Oral Daily  . LORazepam  0.5 mg Oral Q24H  . methadone  5 mg Oral Daily  . pantoprazole  40 mg Oral Daily    Data Reviewed: Basic Metabolic Panel:  Recent Labs Lab 07/06/13 0750 07/07/13 0406 07/08/13 0344  NA 135 135 134*  K 4.6 4.7 4.7  CL 100 103 102  CO2 26 24 23   GLUCOSE 96 89 83  BUN 26* 18 15  CREATININE 1.70* 1.32* 1.15*  CALCIUM 9.2 8.6 8.6   Liver Function Tests: No results found for this basename: AST, ALT, ALKPHOS, BILITOT, PROT, ALBUMIN,  in the  last 168 hours  CBC:  Recent Labs Lab 07/06/13 0750 07/07/13 0406 07/08/13 0344  WBC 3.0* 2.9* 2.7*  NEUTROABS 1.8  --   --   HGB 9.8* 9.5* 8.9*  HCT 30.6* 29.0* 26.6*  MCV 86.9 87.3 85.3  PLT 185 176 162   Cardiac Enzymes:  Recent Labs Lab 07/06/13 0750  TROPONINI <0.30     Recent Results (from the past 240 hour(s))  URINE CULTURE     Status: None   Collection Time    07/06/13  8:28 AM      Result Value Range Status   Specimen Description URINE, CATHETERIZED   Final   Special Requests NONE   Final   Culture  Setup Time     Final   Value: 07/06/2013 17:44     Performed at [facility] Count     Final   Value: >=100,000 COLONIES/ML     Performed at [facility]   Culture     Final   Value: ESCHERICHIA COLI     Performed at [facility]   Report Status 07/08/2013 FINAL   Final   Organism ID, Bacteria ESCHERICHIA COLI   Final  MRSA PCR SCREENING     Status: None   Collection Time    07/06/13 12:50 PM      Result Value Range Status   MRSA by PCR NEGATIVE  NEGATIVE Final   Comment:            The GeneXpert MRSA Assay (FDA     approved for NASAL specimens     only), is one component of a     comprehensive MRSA colonization     surveillance program. It is not     intended to diagnose MRSA     infection nor to guide or     monitor treatment for     MRSA infections.    CT brain:  IMPRESSION:  1. Decreasing right subdural hematoma.  2. No evidence for new hemorrhage.  3. Stable atrophy and white matter disease.   Crista Curb, M.D. [facility] [phone]  If 7PM-7AM, please contact night-coverage [email or web address] Password [email or web address] 07/08/2013, 11:18 AM   LOS: 2 days

## 2013-07-08 NOTE — Evaluation (Signed)
Occupational Therapy Evaluation Patient Details Name: [name] MRN: [id number] DOB: 05-Sep-1916 Today's Date: 07/08/2013 Time: 8295-6213 OT Time Calculation (min): 15 min  OT Assessment / Plan / Recommendation History of present illness 79 y.o. 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 She was in her usual state of health when she had a fall in the closet of her room at the assisted living facility. She denied any symptoms prior to the fall. She was found by nursing staff members undressed on the closet floor. Patient noted to have laceration to right temporal region. She was transferred to [facility] for further evaluation. She had a CT scan of head without contrast which showed underlying subdural hematoma in the right frontal region as well as a small amount of subarachnoid blood in the right frontal region. She then was transferred to the step down unit at [facility] for further evaluation and treatment. Lab work was also significant for acute kidney injury as well as urinary tract infection.   Clinical Impression   Patient evaluated by Occupational Therapy with no further acute OT needs identified. All education has been completed and the patient has no further questions. No family present to provide info re: pt's baseline.  Currently, she requires min A overall for BADLs, but difficult to determine fully due to pt with resistance to performing ADLs outside of normal context and assistance was needed to initiate tasks.  Feel she will benefit from memory unit, and continued OT at facility so that activities are performed within correct context and times of her daily routines See below for any follow-up Occupational Therapy or equipment needs. OT is signing off. Thank you for this referral.     OT Assessment  All further OT needs can be met in the next venue of care    Follow Up Recommendations  Home  health OT;Supervision/Assistance - 24 hour;Other (comment) (ALF)    Barriers to Discharge      Equipment Recommendations  None recommended by OT    Recommendations for Other Services    Frequency       Precautions / Restrictions Precautions Precautions: Fall   Pertinent Vitals/Pain     ADL  Eating/Feeding: Supervision/safety Where Assessed - Eating/Feeding: Chair Grooming: Teeth care;Minimal assistance Where Assessed - Grooming: Supported standing Upper Body Dressing: Moderate assistance Where Assessed - Upper Body Dressing: Supported standing Lower Body Dressing: Minimal assistance Where Assessed - Lower Body Dressing: Supported sit to Pharmacist, hospital: Minimal assistance Statistician Method: Sit to Barista: Comfort height toilet Toileting - Architect and Hygiene: Min guard Where Assessed - Toileting Clothing Manipulation and Hygiene: Standing Transfers/Ambulation Related to ADLs: min A ADL Comments: Pt with difficulty understanding need to perform ADL tasks outside of normal routine context    OT Diagnosis: Generalized weakness;Cognitive deficits  OT Problem List: Decreased strength;Decreased activity tolerance;Impaired balance (sitting and/or standing);Decreased cognition;Decreased knowledge of use of DME or AE;Decreased safety awareness;Pain OT Treatment Interventions:     OT Goals(Current goals can be found in the care plan section)    Visit Information  Last OT Received On: 07/08/13 Assistance Needed: +1 History of Present Illness: 79 y.o. 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 She was in her usual state of health when she had a fall in the closet of her room at the assisted living facility. She denied any symptoms prior  to the fall. She was found by nursing staff members undressed on the closet floor. Patient noted to have laceration to right  temporal region. She was transferred to [facility] for further evaluation. She had a CT scan of head without contrast which showed underlying subdural hematoma in the right frontal region as well as a small amount of subarachnoid blood in the right frontal region. She then was transferred to the step down unit at [facility] for further evaluation and treatment. Lab work was also significant for acute kidney injury as well as urinary tract infection.       Prior Functioning     Home Living Family/patient expects to be discharged to:: Assisted living Additional Comments: unable to verify information, pt poor historian. States that she uses a cane at home Prior Function Comments: unknown Communication Communication: HOH Dominant Hand: Right         Vision/Perception Vision - History Baseline Vision: Other (comment) (Pt unable to provide info) Vision - Assessment Vision Assessment: Vision not tested Additional Comments: Pt unable to participate in visual assesment   Cognition  Cognition Arousal/Alertness: Awake/alert Behavior During Therapy: Flat affect Overall Cognitive Status: No family/caregiver present to determine baseline cognitive functioning    Extremity/Trunk Assessment Upper Extremity Assessment Upper Extremity Assessment: Overall WFL for tasks assessed Lower Extremity Assessment Lower Extremity Assessment: Defer to PT evaluation Cervical / Trunk Assessment Cervical / Trunk Assessment: Kyphotic     Mobility Bed Mobility Bed Mobility: Not assessed Transfers Transfers: Sit to Stand;Stand to Sit Sit to Stand: 4: Min guard;With upper extremity assist;From chair/3-in-1;From toilet Stand to Sit: 4: Min guard;With upper extremity assist;To chair/3-in-1;To toilet Details for Transfer Assistance: min guard assist due balance     Exercise     Balance     End of Session OT - End of Session Activity Tolerance: Patient tolerated treatment well Patient left:  in chair;with call bell/phone within reach;with nursing/sitter in room Nurse Communication: Mobility status;Patient requests pain meds  GO     Loney Domingo, Ursula Alert M 07/08/2013, 3:20 PM

## 2013-07-08 NOTE — Clinical Social Work Note (Signed)
CSW spoke with [facility] (pt's ALF), regarding pt possible returning to ALF at time of discharge. ALF stated that they had spoken with pt's family regarding pt possibly returning if ALF able to accomodate pt's needs. CSW sent pt's clinical information to ALF. ALF stated that after reviewing clinical information, ALF may come assess the pt at [facility] (already discussed and approved by pt's family). CSW spoke with MD regarding pt's prognosis. Per MD, pt's safety sitter to remain due to pt's high fall risk. Per MD, PT/OT to evaluate pt for possible SNF placement (request per family). CSW to continue to follow and assist with discharge planning needs as more information is available.   Darlyn Chamber, LCSWA Clinical Social Worker [phone]

## 2013-07-08 NOTE — Evaluation (Signed)
Physical Therapy Evaluation Patient Details Name: [name] MRN: [id number] DOB: 02/04/1916 Today's Date: 07/08/2013 Time: 4782-9562 PT Time Calculation (min): 17 min  PT Assessment / Plan / Recommendation History of Present Illness  79 y.o. 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 She was in her usual state of health when she had a fall in the closet of her room at the assisted living facility. She denied any symptoms prior to the fall. She was found by nursing staff members undressed on the closet floor. Patient noted to have laceration to right temporal region. She was transferred to [facility] for further evaluation. She had a CT scan of head without contrast which showed underlying subdural hematoma in the right frontal region as well as a small amount of subarachnoid blood in the right frontal region. She then was transferred to the step down unit at [facility] for further evaluation and treatment. Lab work was also significant for acute kidney injury as well as urinary tract infection.  Clinical Impression  Patient demonstrates deficits in functional mobility as indicated below. Patient with baseline cognition deficits and pleasently confused throughout session. Patient very HOH.  No family members available to provide historical and environmental information.  At this time, based on need for assist and cognitive deficits, feel patient will benefit from memory care unit with minimal assist for mobility. Will defer further services to next level of care.    PT Assessment  All further PT needs can be met in the next venue of care    Follow Up Recommendations  Supervision/Assistance - 24 hour;Other (comment) (recommend memory care unit with 24/7 assist)    Does the patient have the potential to tolerate intense rehabilitation      Barriers to Discharge        Equipment Recommendations  None  recommended by PT    Recommendations for Other Services     Frequency      Precautions / Restrictions Precautions Precautions: Fall   Pertinent Vitals/Pain Patient reports head is sore and also complains of upper back pain      Mobility  Bed Mobility Bed Mobility: Not assessed Transfers Transfers: Sit to Stand;Stand to Sit Sit to Stand: 5: Supervision;4: Min guard Stand to Sit: 5: Supervision;4: Min guard Details for Transfer Assistance: performed x 4 Ambulation/Gait Ambulation/Gait Assistance: 4: Min guard;4: Min Environmental consultant (Feet): 310 Feet Assistive device: 1 person hand held assist Ambulation/Gait Assistance Details: some instability noted with ambulation Gait Pattern: Step-through pattern;Decreased stride length;Shuffle;Trunk flexed;Narrow base of support Gait velocity: decreased General Gait Details: small shuffling steps Stairs: No Modified Rankin (Stroke Patients Only) Pre-Morbid Rankin Score: Moderately severe disability Modified Rankin: Moderately severe disability        PT Diagnosis: Abnormality of gait;Generalized weakness  PT Problem List: Decreased strength;Decreased activity tolerance;Decreased balance;Decreased mobility;Decreased cognition    PT Goals(Current goals can be found in the care plan section) Acute Rehab PT Goals PT Goal Formulation: No goals set, d/c therapy  Visit Information  Last PT Received On: 07/08/13 Assistance Needed: +1 History of Present Illness: 79 y.o. 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 She was in her usual state of health when she had a fall in the closet of her room at the assisted living facility. She denied any symptoms prior to the fall. She was found by nursing staff members undressed on the closet  floor. Patient noted to have laceration to right temporal region. She was transferred to [facility] for further  evaluation. She had a CT scan of head without contrast which showed underlying subdural hematoma in the right frontal region as well as a small amount of subarachnoid blood in the right frontal region. She then was transferred to the step down unit at [facility] for further evaluation and treatment. Lab work was also significant for acute kidney injury as well as urinary tract infection.       Prior Functioning  Home Living Family/patient expects to be discharged to:: Assisted living Additional Comments: unable to verify information, pt poor historian. States that she uses a cane at home Prior Function Comments: unknown Communication Communication: HOH Dominant Hand: Right (based on observation of use)    Cognition  Cognition Arousal/Alertness: Awake/alert Behavior During Therapy: WFL for tasks assessed/performed Overall Cognitive Status: History of cognitive impairments - at baseline    Extremity/Trunk Assessment Upper Extremity Assessment Upper Extremity Assessment: Defer to OT evaluation (some LUE drift) Lower Extremity Assessment Lower Extremity Assessment: Generalized weakness Cervical / Trunk Assessment Cervical / Trunk Assessment: Kyphotic   Balance High Level Balance High Level Balance Activites: Side stepping;Direction changes;Turns;Sudden stops High Level Balance Comments: min guard min assist  End of Session PT - End of Session Equipment Utilized During Treatment: Gait belt Activity Tolerance: Patient limited by fatigue Patient left: in chair;with call bell/phone within reach;with nursing/sitter in room Nurse Communication: Mobility status  GP     Fabio Asa 07/08/2013, 11:58 AM Charlotte Crumb, PT DPT  [phone]

## 2013-07-09 DIAGNOSIS — D709 Neutropenia, unspecified: Secondary | ICD-10-CM | POA: Diagnosis present

## 2013-07-09 MED ORDER — BACITRACIN-NEOMYCIN-POLYMYXIN 400-5-5000 EX OINT
TOPICAL_OINTMENT | CUTANEOUS | Status: AC
  Administered 2013-07-09: 1
  Filled 2013-07-09: qty 1

## 2013-07-09 MED ORDER — ACETAMINOPHEN 325 MG PO TABS: 650.0000 mg | ORAL_TABLET | Freq: Four times a day (QID) | ORAL | Status: AC | PRN

## 2013-07-09 MED ORDER — CIPROFLOXACIN HCL 500 MG PO TABS: 250.0000 mg | ORAL_TABLET | Freq: Two times a day (BID) | ORAL | Status: DC

## 2013-07-09 NOTE — Discharge Summary (Signed)
Physician Discharge Summary  [name] JYN:[id number] DOB: 07/24/1916 DOA: 07/06/2013  PCP: Marga Melnick, MD  Admit date: 07/06/2013 Discharge date: 07/09/2013  Time spent: greater than 30 minutes  Recommendations for Outpatient Follow-up:  1. Remove sutures in 7 days  Discharge Diagnoses:  Active Problems:   ANEMIA NOS   SAH (subarachnoid hemorrhage)   SDH (subdural hematoma)   E. coli UTI   Acute kidney injury   Fall at nursing home   Facial laceration   Cognitive impairment   Atrial fibrillation   Hearing impaired   Dementia   Neutropenia   Discharge Condition: stable  Filed Weights   07/06/13 1300 07/07/13 1324  Weight: 50.803 kg (112 lb) 49.9 kg (110 lb 0.2 oz)    History of present illness: 79 y.o. 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 She denied any symptoms prior to the fall, was found by nursing staff members undressed in the closet floor. Patient noted to have laceration to right temporal region. She was transferred to [facility] for further evaluation. Neurosurgeon on call was contacted by the ED physician. No intervention was recommended, just observation and repeat CAT scan. She had a CT scan of head without contrast which showed underlying subdural hematoma in the right frontal region as well as a small amount of subarachnoid blood in the right frontal region. She was transferred to the step down unit at [facility] for further evaluation and treatment. Lab work was also significant for acute kidney injury as well as urinary tract infection. She was administered a dose of ciprofloxacin at [facility]. Her daughter states that there has been discussion amongst family members regarding goals of care, and they would not want patient to  undergo surgical intervention. Her daughter does agree with IV fluids and IV antimicrobial therapy. I have discussed case with Dr. Thad Ranger of neurology who will consult.   Hospital Course:  Patient was admitted to step down.  Aspirin was stopped. Neurology recommended holding aspirin at 3-4 weeks. Patient was found to have an Escherichia coli urinary tract infection which was resistant to trimethoprim sulfamethoxazole, which is what patient had been previously taking. Facial laceration was repaired in the emergency room. Patient remained neurologically intact, though baseline confused. Repeat CAT scan showed improving subdural hematoma. Patient was treated with Cipro and requires 2 more days. Creatinine was higher than baseline and she was given IV fluids and her Lasix has been stopped. She was dehydrated on admission. She has a history of chronic anemia and chronic intermittent neutropenia. CODE STATUS on admission was DO NOT RESUSCITATE. This will be continued. She has worked with physical therapy who recommends transfer back to assisted-living, but switched to the memory care unit. Her trazodone has been stopped and would recommend avoiding any medications that could cause orthostatic hypotension or somnolence, as she remains a fall risk. She was found to be in atrial fibrillation which was new, but is not an anticoagulation candidate.  Procedures:  Laceration repair, right frontal temporal region  Consultations:  Neurology  Discharge Exam: Filed Vitals:   07/09/13 0536  BP: 135/85  Pulse: 100  Temp: 97.8 F (36.6 C)  Resp: 18    General: Asleep. Arousable. Pleasantly confused. Comfortable. HEENT: Ecchymoses about the right face fading. Laceration without  drainage. Cardiovascular: Irregularly irregular Respiratory: Clear to auscultation bilaterally without wheezes rhonchi or rales Abdomen soft nontender nondistended Extremities no clubbing cyanosis or edema  Discharge  Instructions  Discharge Orders   Future Orders Complete By Expires   Diet general  As directed    Discharge instructions  As directed    Comments:     May resume aspirin 81 mg daily in 4 weeks. Remove sutures in 7 days   Walk with assistance  As directed        Medication List    STOP taking these medications       aspirin 81 MG EC tablet     furosemide 20 MG tablet  Commonly known as:  LASIX     sulfamethoxazole-trimethoprim 800-160 MG per tablet  Commonly known as:  BACTRIM DS     traZODone 100 MG tablet  Commonly known as:  DESYREL      TAKE these medications       acetaminophen 325 MG tablet  Commonly known as:  TYLENOL  Take 2 tablets (650 mg total) by mouth every 6 (six) hours as needed.     ciprofloxacin 500 MG tablet  Commonly known as:  CIPRO  Take 0.5 tablets (250 mg total) by mouth 2 (two) times daily. For 4 doses     ferrous sulfate 325 (65 FE) MG tablet  Take 325 mg by mouth every morning.     FLUoxetine 10 MG capsule  Commonly known as:  PROZAC  Take 30 mg by mouth daily. Take 3 capsules by mouth daily.     HYDROcodone-acetaminophen 5-325 MG per tablet  Commonly known as:  NORCO/VICODIN  Take 0.5 tablets by mouth every 6 (six) hours as needed for pain.     LORazepam 0.5 MG tablet  Commonly known as:  ATIVAN  Take 0.5 mg by mouth daily. Take one tablet by mouth daily at 2 pm.     methadone 5 MG tablet  Commonly known as:  DOLOPHINE  Take 5 mg by mouth daily.     omeprazole 20 MG capsule  Commonly known as:  PRILOSEC  Take 20 mg by mouth daily.     Vitamin D3 1000 UNITS Caps  Take 1 capsule by mouth daily.       Allergies  Allergen Reactions  . Morphine Sulfate     REACTION:difficulty breathing  . Penicillins     REACTION: difficulty breathing  . Aminophylline   . Clarithromycin     REACTION: unspecified  . Oxycodone Hcl     REACTION: unspecified  . Sulfamethoxazole     REACTION: unspecified  . Sulfonamide Derivatives      REACTION: unspecified       Follow-up Information   Follow up with Marga Melnick, MD In 1 week.   Specialty:  Internal Medicine   Contact information:   [phone] [address] [address] [place] [address] [phone]        The results of significant diagnostics from this hospitalization (including imaging, microbiology, ancillary and laboratory) are listed below for reference.    Significant Diagnostic Studies: Ct Head Wo Contrast  07/08/2013   CLINICAL DATA:  The intracranial hemorrhage.  EXAM: CT HEAD WITHOUT CONTRAST  TECHNIQUE: Contiguous axial images were obtained from the base of the skull through the vertex without intravenous contrast.  COMPARISON:  CT head without contrast 07/06/2013.  FINDINGS: The right subdural hemorrhage has decreased since the prior exam. There is no new hemorrhage. Mild prominence  of the extra-axial spaces is stable. Atrophy and white matter disease is unchanged. No acute cortical infarct or mass lesion is present. There is no parenchymal hemorrhage.  The paranasal sinuses and mastoid air cells are clear. The osseous skull is intact.  IMPRESSION: 1. Decreasing right subdural hematoma. 2. No evidence for new hemorrhage. 3. Stable atrophy and white matter disease.   Electronically Signed   By: Gennette Pac M.D.   On: 07/08/2013 08:40   Ct Head Wo Contrast  07/06/2013   CLINICAL DATA:  History of trauma from a fall.  EXAM: CT HEAD WITHOUT CONTRAST  CT CERVICAL SPINE WITHOUT CONTRAST  TECHNIQUE: Multidetector CT imaging of the head and cervical spine was performed following the standard protocol without intravenous contrast. Multiplanar CT image reconstructions of the cervical spine were also generated.  COMPARISON:  Head CT 11/06/2008.  FINDINGS: CT HEAD FINDINGS  Image 12 of series 6 demonstrates a small amount of extra-axial high attenuation material in the right frontal region, compatible with a small amount of subdural hemorrhage. There is  also a small focus of high attenuation in the subarachnoid space in the right frontal region on image 17 of series 6. No intraventricular hemorrhage identified at this time. No hydrocephalus. No intraparenchymal hemorrhage. Moderate cerebral and cerebellar atrophy. Patchy and confluent areas of decreased attenuation are noted throughout the deep and periventricular white matter of the cerebral hemispheres bilaterally, compatible with chronic microvascular ischemic disease. No mass effect and midline shift. No acute displaced skull fractures are identified. Visualized paranasal sinuses and mastoids are well pneumatized. Small amount of soft tissue swelling in the right frontal scalp and periorbital region.  CT CERVICAL SPINE FINDINGS  No acute displaced fractures of the cervical spine. There is severe multilevel degenerative disc disease throughout the cervical spine, most pronounced at C5-C6 and C6-C7. Severe multilevel facet arthropathy. Prevertebral soft tissues are normal. Visualized portions of the upper thorax are unremarkable.  IMPRESSION: 1. Right frontal scalp and periorbital contusion with underlying subdural hematoma in the right frontal region, and small amount of subarachnoid blood in the right frontal region as well. 2. Negative for skull fracture. 3. No evidence of significant acute traumatic injury to the cervical spine. 4. Moderate cerebral and cerebellar atrophy with chronic microvascular ischemic changes in the cerebral white matter, as above. 5. Severe multilevel degenerative disc disease and cervical spondylosis.   Electronically Signed   By: Trudie Reed M.D.   On: 07/06/2013 08:00   Ct Cervical Spine Wo Contrast  07/06/2013   CLINICAL DATA:  History of trauma from a fall.  EXAM: CT HEAD WITHOUT CONTRAST  CT CERVICAL SPINE WITHOUT CONTRAST  TECHNIQUE: Multidetector CT imaging of the head and cervical spine was performed following the standard protocol without intravenous contrast.  Multiplanar CT image reconstructions of the cervical spine were also generated.  COMPARISON:  Head CT 11/06/2008.  FINDINGS: CT HEAD FINDINGS  Image 12 of series 6 demonstrates a small amount of extra-axial high attenuation material in the right frontal region, compatible with a small amount of subdural hemorrhage. There is also a small focus of high attenuation in the subarachnoid space in the right frontal region on image 17 of series 6. No intraventricular hemorrhage identified at this time. No hydrocephalus. No intraparenchymal hemorrhage. Moderate cerebral and cerebellar atrophy. Patchy and confluent areas of decreased attenuation are noted throughout the deep and periventricular white matter of the cerebral hemispheres bilaterally, compatible with chronic microvascular ischemic disease. No mass effect and midline shift. No  acute displaced skull fractures are identified. Visualized paranasal sinuses and mastoids are well pneumatized. Small amount of soft tissue swelling in the right frontal scalp and periorbital region.  CT CERVICAL SPINE FINDINGS  No acute displaced fractures of the cervical spine. There is severe multilevel degenerative disc disease throughout the cervical spine, most pronounced at C5-C6 and C6-C7. Severe multilevel facet arthropathy. Prevertebral soft tissues are normal. Visualized portions of the upper thorax are unremarkable.  IMPRESSION: 1. Right frontal scalp and periorbital contusion with underlying subdural hematoma in the right frontal region, and small amount of subarachnoid blood in the right frontal region as well. 2. Negative for skull fracture. 3. No evidence of significant acute traumatic injury to the cervical spine. 4. Moderate cerebral and cerebellar atrophy with chronic microvascular ischemic changes in the cerebral white matter, as above. 5. Severe multilevel degenerative disc disease and cervical spondylosis.   Electronically Signed   By: Trudie Reed M.D.   On:  07/06/2013 08:00    Microbiology: Recent Results (from the past 240 hour(s))  URINE CULTURE     Status: None   Collection Time    07/06/13  8:28 AM      Result Value Range Status   Specimen Description URINE, CATHETERIZED   Final   Special Requests NONE   Final   Culture  Setup Time     Final   Value: 07/06/2013 17:44     Performed at [facility] Count     Final   Value: >=100,000 COLONIES/ML     Performed at [facility]   Culture     Final   Value: ESCHERICHIA COLI     Performed at [facility]   Report Status 07/08/2013 FINAL   Final   Organism ID, Bacteria ESCHERICHIA COLI   Final  MRSA PCR SCREENING     Status: None   Collection Time    07/06/13 12:50 PM      Result Value Range Status   MRSA by PCR NEGATIVE  NEGATIVE Final   Comment:            The GeneXpert MRSA Assay (FDA     approved for NASAL specimens     only), is one component of a     comprehensive MRSA colonization     surveillance program. It is not     intended to diagnose MRSA     infection nor to guide or     monitor treatment for     MRSA infections.     Labs: Basic Metabolic Panel:  Recent Labs Lab 07/06/13 0750 07/07/13 0406 07/08/13 0344  NA 135 135 134*  K 4.6 4.7 4.7  CL 100 103 102  CO2 26 24 23   GLUCOSE 96 89 83  BUN 26* 18 15  CREATININE 1.70* 1.32* 1.15*  CALCIUM 9.2 8.6 8.6   Liver Function Tests: No results found for this basename: AST, ALT, ALKPHOS, BILITOT, PROT, ALBUMIN,  in the last 168 hours No results found for this basename: LIPASE, AMYLASE,  in the last 168 hours No results found for this basename: AMMONIA,  in the last 168 hours CBC:  Recent Labs Lab 07/06/13 0750 07/07/13 0406 07/08/13 0344  WBC 3.0* 2.9* 2.7*  NEUTROABS 1.8  --   --   HGB 9.8* 9.5* 8.9*  HCT 30.6* 29.0* 26.6*  MCV 86.9 87.3 85.3  PLT 185 176 162   Cardiac Enzymes:  Recent Labs Lab 07/06/13 0750  TROPONINI <  0.30   BNP: BNP (last 3 results) No  results found for this basename: PROBNP,  in the last 8760 hours CBG: No results found for this basename: GLUCAP,  in the last 168 hours  EKG Atrial fibrillation Right bundle branch block Left anterior fascicular block  Signed:  Husayn Reim L  [facility] 07/09/2013, 12:59 PM

## 2013-07-09 NOTE — Progress Notes (Signed)
CSW (Clinical Child psychotherapist) prepared pt dc packet and placed with shadow chart. CSW has faxed dc paperwork to facility and received call from [place] confirming pt can dc. CSW spoke with pt son and informed about dc to ALF. CSW arranged for non emergent ambulance transport. Pt nurse aware. CSW signing off.  Webb Weed, LCSWA [phone]

## 2013-07-10 ENCOUNTER — Emergency Department (HOSPITAL_COMMUNITY)
Admission: EM | Admit: 2013-07-10 | Discharge: 2013-07-10 | Disposition: A | Discharge status: Home / Self Care | Payer: Medicare Other | Attending: Emergency Medicine | Admitting: Emergency Medicine

## 2013-07-10 ENCOUNTER — Encounter (HOSPITAL_COMMUNITY): Payer: Self-pay | Admitting: Emergency Medicine

## 2013-07-10 ENCOUNTER — Emergency Department (HOSPITAL_COMMUNITY): Payer: Medicare Other

## 2013-07-10 DIAGNOSIS — Z8719 Personal history of other diseases of the digestive system: Secondary | ICD-10-CM | POA: Insufficient documentation

## 2013-07-10 DIAGNOSIS — Z8679 Personal history of other diseases of the circulatory system: Secondary | ICD-10-CM | POA: Insufficient documentation

## 2013-07-10 DIAGNOSIS — X58XXXA Exposure to other specified factors, initial encounter: Secondary | ICD-10-CM | POA: Insufficient documentation

## 2013-07-10 DIAGNOSIS — M1990 Unspecified osteoarthritis, unspecified site: Secondary | ICD-10-CM | POA: Insufficient documentation

## 2013-07-10 DIAGNOSIS — Z885 Allergy status to narcotic agent status: Secondary | ICD-10-CM | POA: Insufficient documentation

## 2013-07-10 DIAGNOSIS — S0003XA Contusion of scalp, initial encounter: Secondary | ICD-10-CM | POA: Insufficient documentation

## 2013-07-10 DIAGNOSIS — Z880 Allergy status to penicillin: Secondary | ICD-10-CM | POA: Insufficient documentation

## 2013-07-10 DIAGNOSIS — Z79899 Other long term (current) drug therapy: Secondary | ICD-10-CM | POA: Insufficient documentation

## 2013-07-10 DIAGNOSIS — Z8701 Personal history of pneumonia (recurrent): Secondary | ICD-10-CM | POA: Insufficient documentation

## 2013-07-10 DIAGNOSIS — Z66 Do not resuscitate: Secondary | ICD-10-CM | POA: Insufficient documentation

## 2013-07-10 DIAGNOSIS — F0390 Unspecified dementia without behavioral disturbance: Secondary | ICD-10-CM

## 2013-07-10 DIAGNOSIS — M353 Polymyalgia rheumatica: Secondary | ICD-10-CM | POA: Insufficient documentation

## 2013-07-10 DIAGNOSIS — R4182 Altered mental status, unspecified: Secondary | ICD-10-CM | POA: Insufficient documentation

## 2013-07-10 DIAGNOSIS — Z4801 Encounter for change or removal of surgical wound dressing: Secondary | ICD-10-CM | POA: Insufficient documentation

## 2013-07-10 DIAGNOSIS — Z882 Allergy status to sulfonamides status: Secondary | ICD-10-CM | POA: Insufficient documentation

## 2013-07-10 DIAGNOSIS — F0391 Unspecified dementia with behavioral disturbance: Secondary | ICD-10-CM | POA: Insufficient documentation

## 2013-07-10 DIAGNOSIS — Z881 Allergy status to other antibiotic agents status: Secondary | ICD-10-CM | POA: Insufficient documentation

## 2013-07-10 DIAGNOSIS — Z888 Allergy status to other drugs, medicaments and biological substances status: Secondary | ICD-10-CM | POA: Insufficient documentation

## 2013-07-10 DIAGNOSIS — J45909 Unspecified asthma, uncomplicated: Secondary | ICD-10-CM | POA: Insufficient documentation

## 2013-07-10 DIAGNOSIS — Z853 Personal history of malignant neoplasm of breast: Secondary | ICD-10-CM | POA: Insufficient documentation

## 2013-07-10 DIAGNOSIS — F03918 Unspecified dementia, unspecified severity, with other behavioral disturbance: Secondary | ICD-10-CM | POA: Insufficient documentation

## 2013-07-10 DIAGNOSIS — Z7982 Long term (current) use of aspirin: Secondary | ICD-10-CM | POA: Insufficient documentation

## 2013-07-10 DIAGNOSIS — F3289 Other specified depressive episodes: Secondary | ICD-10-CM | POA: Insufficient documentation

## 2013-07-10 DIAGNOSIS — R451 Restlessness and agitation: Secondary | ICD-10-CM

## 2013-07-10 DIAGNOSIS — Y929 Unspecified place or not applicable: Secondary | ICD-10-CM | POA: Insufficient documentation

## 2013-07-10 DIAGNOSIS — Y939 Activity, unspecified: Secondary | ICD-10-CM | POA: Insufficient documentation

## 2013-07-10 DIAGNOSIS — I252 Old myocardial infarction: Secondary | ICD-10-CM | POA: Insufficient documentation

## 2013-07-10 DIAGNOSIS — F329 Major depressive disorder, single episode, unspecified: Secondary | ICD-10-CM | POA: Insufficient documentation

## 2013-07-10 MED ORDER — HALOPERIDOL LACTATE 5 MG/ML IJ SOLN: 2.0000 mg | Freq: Once | INTRAMUSCULAR | Status: DC

## 2013-07-10 MED ORDER — LORAZEPAM 2 MG/ML IJ SOLN
1.0000 mg | Freq: Once | INTRAMUSCULAR | Status: AC
  Administered 2013-07-10: 1 mg via INTRAMUSCULAR
  Filled 2013-07-10: qty 1

## 2013-07-10 NOTE — ED Notes (Addendum)
Per EMS: pt from [facility]  [address] was given ativan and hydrocodone last night prior to bedtime due to not sleeping. Pt did not sleep for several hours after medication given. Pt woke this morning and was agitated according to staff. Pt sent here for agitation and because NH states that she was diagnosed with a head bleed three days ago at our facility. Pt has old bruise and sutures to right side of temple from previous visit. Pt sleeping with EMS complaining of being cold.

## 2013-07-10 NOTE — ED Notes (Signed)
Pt continues to call Nurse, trying to get out of bed, pt not alert to place and does not realized she is in the hospital.

## 2013-07-10 NOTE — ED Provider Notes (Signed)
CSN: [id number]     Arrival date & time 07/10/13  4098 History   First MD Initiated Contact with Patient 07/10/13 [phone]     Chief Complaint  Patient presents with  . Dementia   (Consider location/radiation/quality/duration/timing/severity/associated sxs/prior Treatment) The history is provided by the patient, the EMS personnel and the nursing home. The history is limited by the condition of the patient.  Khadijah H Wojnar is a 79 y.o. female history dementia, asthma, MI, recent subdural hemorrhage presenting with agitation. She recently was diagnosed with subdural hemorrhage. CT 2 days ago showed a hemorrhage improving. Patient is DO NOT RESUSCITATE so family did not want to any operations at this point. She went back to the nursing home yesterday and she was more agitated than usual this morning. She was given some Ativan and hydrocodone but was still agitated. She came by EMS and has been calm.    Level V caveat- dementia   Past Medical History  Diagnosis Date  . Asthma   . Depression   . Diverticulosis   . Polymyalgia rheumatica 2008  . Depression   . Osteoarthritis   . Retinal detachment     left eye S/P silicone injection  . Myocardial infarct   . Pneumonia 11/2011    treated as OP  . Breast cancer 1987   Past Surgical History  Procedure Laterality Date  . Cesarean section      x3  . Cholecystectomy    . Biopsy thyroid  04-2004    needle  . Mrsa cellulitis  02-2006  . Shoulder surgery  11-2003  . Colonoscopy w/ polypectomy    . Partial gastrectomy  1977    [place]  . Double mastectomy  1987    no post op therapy   Family History  Problem Relation Age of Onset  . Hypertension Mother   . Cancer Sister     intestinal  . Breast cancer Daughter   . Diabetes Neg Hx   . Stroke Neg Hx    History  Substance Use Topics  . Smoking status: Never Smoker   . Smokeless tobacco: Not on file  . Alcohol Use: No   OB History   Grav Para Term Preterm Abortions TAB SAB  Ect Mult Living                 Review of Systems  Unable to perform ROS: Dementia    Allergies  Morphine sulfate; Penicillins; Aminophylline; Clarithromycin; Oxycodone hcl; Sulfamethoxazole; and Sulfonamide derivatives  Home Medications   Current Outpatient Rx  Name  Route  Sig  Dispense  Refill  . aspirin 81 MG chewable tablet   Oral   Chew 81 mg by mouth daily.         . Cholecalciferol (VITAMIN D3) 1000 UNITS CAPS   Oral   Take 1 capsule by mouth daily.         . ciprofloxacin (CIPRO) 500 MG tablet   Oral   Take 0.5 tablets (250 mg total) by mouth 2 (two) times daily. For 4 doses   4 tablet   0   . ferrous sulfate 325 (65 FE) MG tablet   Oral   Take 325 mg by mouth every morning.         Marland Kitchen FLUoxetine (PROZAC) 10 MG capsule   Oral   Take 30 mg by mouth daily. Take 3 capsules by mouth daily.         . furosemide (LASIX) 20 MG tablet  Oral   Take 10 mg by mouth daily.         Marland Kitchen HYDROcodone-acetaminophen (NORCO/VICODIN) 5-325 MG per tablet   Oral   Take 0.5 tablets by mouth every 6 (six) hours as needed for pain.         Marland Kitchen LORazepam (ATIVAN) 0.5 MG tablet   Oral   Take 0.5 mg by mouth daily. Take one tablet by mouth daily at 2 pm.         . methadone (DOLOPHINE) 5 MG tablet   Oral   Take 5 mg by mouth daily.         Marland Kitchen omeprazole (PRILOSEC) 20 MG capsule   Oral   Take 20 mg by mouth daily.         . traZODone (DESYREL) 50 MG tablet   Oral   Take 50 mg by mouth at bedtime.         Marland Kitchen acetaminophen (TYLENOL) 325 MG tablet   Oral   Take 2 tablets (650 mg total) by mouth every 6 (six) hours as needed.          BP 137/72  Pulse 86  Temp(Src) 97.7 F (36.5 C) (Oral)  Resp 14  SpO2 96% Physical Exam  Nursing note and vitals reviewed. Constitutional:  Demented, chronically ill   HENT:  Head: Normocephalic.  Mouth/Throat: Oropharynx is clear and moist.  Sutures on R eyebrow. Some echymosis but no facial tenderness.   Eyes:  Pupils are equal, round, and reactive to light.  Neck: Normal range of motion.  Cardiovascular: Normal rate, regular rhythm and normal heart sounds.   Pulmonary/Chest: Effort normal and breath sounds normal. No respiratory distress. She has no wheezes. She has no rales.  Abdominal: Soft. Bowel sounds are normal. She exhibits no distension. There is no tenderness. There is no rebound.  Musculoskeletal: Normal range of motion.  Neurological: She is alert.  Demented, moving all extremities   Skin: Skin is warm.  Psychiatric:  Unable     ED Course  Procedures (including critical care time) Labs Review Labs Reviewed - No data to display Imaging Review Ct Head Wo Contrast  07/10/2013   CLINICAL DATA:  Altered mental status  EXAM: CT HEAD WITHOUT CONTRAST  TECHNIQUE: Contiguous axial images were obtained from the base of the skull through the vertex without intravenous contrast.  COMPARISON:  Head CT from 2 days prior  FINDINGS: Skull and Sinuses:No significant abnormality.  Orbits: Oil injection in the left eye. Right-sided cataract resection.  Brain: No acute abnormality such is acute hemorrhage, hydrocephalus, large territory infarct, or mass lesion/shift. There is brain atrophy and chronic small vessel ischemic changes which is stable. There is a subdural collection, mildly greater density than CSF, around the mid and anterior right cerebral convexity (6mm in maximal thickness). There is very likely also a subdural collection on the left, of similar size and extent. Asymmetrically increased extra-axial space around the right cerebellum, out of proportion to cerebellar atrophy/ foliar enlargement - likely a 17mm thick chronic hygroma.  IMPRESSION: 1. No acute intracranial abnormality, such as acute hemorrhage or acute ischemia. 2. Thin bilateral subdural collections/hygromas, without mass effect on the brain.   Electronically Signed   By: Tiburcio Pea M.D.   On: 07/10/2013 06:42   Ct Head Wo  Contrast  07/08/2013   CLINICAL DATA:  The intracranial hemorrhage.  EXAM: CT HEAD WITHOUT CONTRAST  TECHNIQUE: Contiguous axial images were obtained from the base of the skull through the  vertex without intravenous contrast.  COMPARISON:  CT head without contrast 07/06/2013.  FINDINGS: The right subdural hemorrhage has decreased since the prior exam. There is no new hemorrhage. Mild prominence of the extra-axial spaces is stable. Atrophy and white matter disease is unchanged. No acute cortical infarct or mass lesion is present. There is no parenchymal hemorrhage.  The paranasal sinuses and mastoid air cells are clear. The osseous skull is intact.  IMPRESSION: 1. Decreasing right subdural hematoma. 2. No evidence for new hemorrhage. 3. Stable atrophy and white matter disease.   Electronically Signed   By: Gennette Pac M.D.   On: 07/08/2013 08:40    EKG Interpretation   None       MDM  No diagnosis found. Alina H Rovira is a 79 y.o. female here with agitation. Calm now. Given recent subdural hemorrhage will repeat ct. Otherwise, likely agitation from dementia.   7:00 AM CT head showed improved subdural hematoma. Stable for d/c back.     Richardean Canal, MD 07/10/13 0700

## 2013-07-10 NOTE — ED Notes (Signed)
Report called to [facility].

## 2013-07-10 NOTE — ED Notes (Signed)
Pt sleeping, easily arousable. Pt alert to person, date of birth. Pt disoriented to time and location. Pt does not appear agitated, nor does she have any complaints. When asked why she is here pt states I don't know.

## 2013-08-02 ENCOUNTER — Other Ambulatory Visit: Payer: Self-pay

## 2013-08-02 MED ORDER — LORAZEPAM 0.5 MG PO TABS: ORAL_TABLET | ORAL | Status: DC

## 2013-08-02 MED ORDER — HYDROCODONE-ACETAMINOPHEN 5-325 MG PO TABS: ORAL_TABLET | ORAL | Status: AC

## 2013-08-02 MED ORDER — METHADONE HCL 5 MG PO TABS: 5.0000 mg | ORAL_TABLET | Freq: Every day | ORAL | Status: DC

## 2013-08-03 ENCOUNTER — Non-Acute Institutional Stay (SKILLED_NURSING_FACILITY): Payer: Medicare Other | Admitting: Internal Medicine

## 2013-08-03 DIAGNOSIS — F0281 Dementia in other diseases classified elsewhere with behavioral disturbance: Secondary | ICD-10-CM

## 2013-08-03 DIAGNOSIS — S065X9A Traumatic subdural hemorrhage with loss of consciousness of unspecified duration, initial encounter: Secondary | ICD-10-CM

## 2013-08-03 DIAGNOSIS — I6200 Nontraumatic subdural hemorrhage, unspecified: Secondary | ICD-10-CM

## 2013-08-03 DIAGNOSIS — N179 Acute kidney failure, unspecified: Secondary | ICD-10-CM

## 2013-08-19 NOTE — Progress Notes (Addendum)
Patient ID: [name], female   DOB: 12-23-1915, 79 y.o.   MRN: [id number]           HISTORY & PHYSICAL  DATE:  08/03/2013    FACILITY: [facility]    LEVEL OF CARE:   SNF   CHIEF COMPLAINT:  Admission to the facility, post transfer from [facility].    HISTORY OF PRESENT ILLNESS:  As I understand things, this is a 79 year-old woman with a past history of cognitive impairment; nevertheless, fairly functional, living in an assisted living and walking with a walker.    She was admitted to hospital on October 28th, found by staff in the assisted living to be undressed and on the closet floor.  She had a right temporal laceration.  CT scan of the head showed an underlying subdural hematoma in the right frontal region as well as a small amount of subarachnoid blood in the right frontal region.  The patient was stated to be a nonsurgical candidate by her family.  She was seen by Dr. Thad Ranger of Neurology.    She was returned to the assisted living and things did not go well.  She continued to attempt to wander, disrobe and walk around the community naked.  Presumably, this type of behavior was unusual.    PAST MEDICAL HISTORY/PROBLEM LIST:  New subdural and subarachnoid hemorrhage in the right frontal region.  Her antiplatelet meds were put on hold for 3-4 weeks.    E.coli UTI with greater than 100,000 E.coli.  She was treated with Bactrim for 10 days.    Atrial fibrillation, which appeared to be of new onset.  She was rate controlled and not felt to be an anticoagulation candidate.    Acute kidney injury.  She was given IV rehydration.  Her Lasix was put on hold.    Asthma.    Diverticulosis.    History of polymyalgia rheumatica.    Depression.    Osteoarthritis.    Retinal detachment.    Myocardial infarction.    Pneumonia.    History of breast cancer, status post bilateral mastectomies in the 1980s.    Chronic pain, on 1 tablet of oral methadone 5 mg a  day.    Gastroesophageal reflux disease.    CURRENT MEDICATIONS:  Medication list is reviewed.    Ferrous sulfate 325 q.a.m.    Fluoxetine 10 mg, 3 tablets daily.    Ativan 0.5 q.d.   Methadone 5 mg daily.    Prilosec 20 q.d.    Vitamin D 1000 U daily.    Hydrocodone/APAP 1/2 tablet q.6.     Metoprolol 12.5 p.o. b.i.d.    ASA 81 q.d., to be started on December 1st.    SOCIAL HISTORY:  Other than the fact that the patient came from [facility] and apparently was more functional prior to her head trauma, I have no information.   CODE STATUS:  She is a DNR and has a STOP form on the chart.    FAMILY HISTORY:  Not currently available.    REVIEW OF SYSTEMS:  Very limited due to mental status changes as well as hearing loss.  Occupational Therapy tells me that the patient is able to participate in her ADLs.    PHYSICAL EXAMINATION:   GENERAL APPEARANCE:   79 year-old woman, lying in bed, in no distress.   HEENT:   MOUTH/THROAT:   Somewhat dry mucous membranes.   EARS:   INTERNAL EARS:  She has significant  hearing loss.   CHEST/RESPIRATORY:  Clear air entry bilaterally.   CARDIOVASCULAR:  CARDIAC:   Heart sounds are normal.  There are no murmurs.  Perhaps some volume contraction.   BREASTS:  She has had bilateral mastectomies, but no rib is palpable in the axilla or supraclavicular areas.   GASTROINTESTINAL:  ABDOMEN:   There are surgical scars, epigastric and likely a cholecystectomy scar.   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   GENITOURINARY:  BLADDER:   No bladder distention.  No costovertebral angle tenderness.   CIRCULATION:  EDEMA/VARICOSITIES:  Extremities:  She has significant venous stasis, but no open areas.   NEUROLOGICAL:   She has no pronator drift.  There is bilateral lower extremity weakness, left greater than right, although she has antigravity strength bilaterally.   I believe she has an extensor plantar on the left.   PSYCHIATRIC:   MENTAL  STATUS:   She is able to tell me the year of her birth, but could not state the current year or month.      ASSESSMENT/PLAN:  Status post subdural hematoma and subarachnoid hemorrhage.  This was within the last month.  She  apparently has had a significant change in mental status and functional level.    Pre-existing history of dementia, if I am reading the records currently.    History of acute renal insufficiency.    History of depression.  On Prozac 30 mg.    Anemia.  On iron.    Chronic pain.  On methadone.    Chronic venous insufficiency, which at least at the bedside today accounts for most of her edema.    Other than basic lab work at this point, I will have to see how this woman does.  Recovery after subarachnoid/subdural hematomas takes months.   Apparently, the patient is here for long-term placement as her assisted living could not meet her current care needs.  She does not appear to be delirious or overtly depressed.    CPT CODE: 96045

## 2013-08-23 ENCOUNTER — Non-Acute Institutional Stay (SKILLED_NURSING_FACILITY): Payer: Medicare Other | Admitting: Nurse Practitioner

## 2013-08-23 DIAGNOSIS — F0391 Unspecified dementia with behavioral disturbance: Secondary | ICD-10-CM

## 2013-08-23 DIAGNOSIS — R609 Edema, unspecified: Secondary | ICD-10-CM

## 2013-08-23 DIAGNOSIS — D649 Anemia, unspecified: Secondary | ICD-10-CM

## 2013-08-23 DIAGNOSIS — F03918 Unspecified dementia, unspecified severity, with other behavioral disturbance: Secondary | ICD-10-CM

## 2013-08-23 DIAGNOSIS — F411 Generalized anxiety disorder: Secondary | ICD-10-CM

## 2013-08-23 NOTE — Progress Notes (Signed)
Patient ID: [name], female   DOB: September 06, 1916, 79 y.o.   MRN: [id number]    Nursing Home Location:  [facility]   Place of Service: SNF (31)  PCP: Marga Melnick, MD  Allergies  Allergen Reactions  . Morphine Sulfate     REACTION:difficulty breathing  . Penicillins     REACTION: difficulty breathing  . Aminophylline   . Clarithromycin     REACTION: unspecified  . Oxycodone Hcl     REACTION: unspecified  . Sulfamethoxazole     REACTION: unspecified  . Sulfonamide Derivatives     REACTION: unspecified    Chief Complaint  Patient presents with  . Medical Managment of Chronic Issues    HPI:  79 year old female with pmh of asthma, depression, OA, PMR, dementia who is a long term resident of [facility]. Pt is being seen today for routine follow up; staff reports pts anxiety is worse and she frequently yells out in fear of something happening to her. She is unable to be redirected and it is worse at night.   Review of Systems:  Unable to obtain  Past Medical History  Diagnosis Date  . Asthma   . Depression   . Diverticulosis   . Polymyalgia rheumatica 2008  . Depression   . Osteoarthritis   . Retinal detachment     left eye S/P silicone injection  . Myocardial infarct   . Pneumonia 11/2011    treated as OP  . Breast cancer 1987   Past Surgical History  Procedure Laterality Date  . Cesarean section      x3  . Cholecystectomy    . Biopsy thyroid  04-2004    needle  . Mrsa cellulitis  02-2006  . Shoulder surgery  11-2003  . Colonoscopy w/ polypectomy    . Partial gastrectomy  1977    [place], [place]  . Double mastectomy  1987    no post op therapy   Social History:   reports that she has never smoked. She does not have any smokeless tobacco history on file. She reports that she does not drink alcohol or use illicit drugs.  Family History  Problem Relation Age of Onset  . Hypertension Mother   . Cancer Sister     intestinal  . Breast  cancer Daughter   . Diabetes Neg Hx   . Stroke Neg Hx     Medications: Patient's Medications  New Prescriptions   No medications on file  Previous Medications   ACETAMINOPHEN (TYLENOL) 325 MG TABLET    Take 2 tablets (650 mg total) by mouth every 6 (six) hours as needed.   ASPIRIN 81 MG CHEWABLE TABLET    Chew 81 mg by mouth daily.   CHOLECALCIFEROL (VITAMIN D3) 1000 UNITS CAPS    Take 1 capsule by mouth daily.   FERROUS SULFATE 325 (65 FE) MG TABLET    Take 325 mg by mouth every morning.   FLUOXETINE (PROZAC) 10 MG CAPSULE    Take 30 mg by mouth daily. Take 3 capsules by mouth daily.   FUROSEMIDE (LASIX) 20 MG TABLET    Take 10 mg by mouth daily.   HYDROCODONE-ACETAMINOPHEN (NORCO/VICODIN) 5-325 MG PER TABLET    1/2 by mouth every 6 hours as needed DO NOT EXCEED 4 GM OF TYLENOL IN 24 HOURS   LORAZEPAM (ATIVAN) 0.5 MG TABLET    Take one tablet by mouth daily at 2 pm. Take 1 by mouth twice a day  as needed   METHADONE (DOLOPHINE) 5 MG TABLET    Take 1 tablet (5 mg total) by mouth daily.   OMEPRAZOLE (PRILOSEC) 20 MG CAPSULE    Take 20 mg by mouth daily.   TRAZODONE (DESYREL) 50 MG TABLET    Take 50 mg by mouth at bedtime.  Modified Medications   No medications on file  Discontinued Medications   CIPROFLOXACIN (CIPRO) 500 MG TABLET    Take 0.5 tablets (250 mg total) by mouth 2 (two) times daily. For 4 doses     Physical Exam: Physical Exam  Constitutional:  Mildly distressed (yelling out continuously- does stop to answer questions)  HENT:  Mouth/Throat: Oropharynx is clear and moist. No oropharyngeal exudate.  Eyes: Conjunctivae and EOM are normal. Pupils are equal, round, and reactive to light.  Neck: Normal range of motion. Neck supple.  Cardiovascular: Normal rate, regular rhythm and normal heart sounds.   Pulmonary/Chest: Effort normal and breath sounds normal.  Abdominal: Soft. Bowel sounds are normal.  Musculoskeletal: She exhibits no edema and no tenderness.  Neurological:  She is alert.  Skin: Skin is warm and dry. She is not diaphoretic.    Filed Vitals:   08/23/13 1405  BP: 131/77  Pulse: 74  Temp: 98.1 F (36.7 C)  Resp: 20      Labs reviewed: Basic Metabolic Panel:  Recent Labs  28/41/32 0750 07/07/13 0406 07/08/13 0344  NA 135 135 134*  K 4.6 4.7 4.7  CL 100 103 102  CO2 26 24 23   GLUCOSE 96 89 83  BUN 26* 18 15  CREATININE 1.70* 1.32* 1.15*  CALCIUM 9.2 8.6 8.6   CBC:  Recent Labs  07/06/13 0750 07/07/13 0406 07/08/13 0344  WBC 3.0* 2.9* 2.7*  NEUTROABS 1.8  --   --   HGB 9.8* 9.5* 8.9*  HCT 30.6* 29.0* 26.6*  MCV 86.9 87.3 85.3  PLT 185 176 162     Assessment/Plan 1. Anxiety state, unspecified -worse; frequently calling out, staying up all night yelling -will dc prozac and start zoloft 50 mg q day -will increase ativan to q 6 hours as needed -lab work cbc, bmp, tsh, UA c&S ordered to rule out infectious or other causes for increase in behaviors   2. ANEMIA NOS -currently on iron supplement; will follow up cbc  3. EDEMA- LOCALIZED -on lasix; will follow up BMP  4. Dementia Will add aricept 5 mg qhs and to increase to 10 mg qhs at this time

## 2013-09-06 ENCOUNTER — Other Ambulatory Visit: Payer: Self-pay | Admitting: *Deleted

## 2013-09-06 MED ORDER — LORAZEPAM 0.5 MG PO TABS: ORAL_TABLET | ORAL | Status: DC

## 2013-09-06 MED ORDER — METHADONE HCL 5 MG PO TABS: 5.0000 mg | ORAL_TABLET | Freq: Every day | ORAL | Status: DC

## 2013-09-13 ENCOUNTER — Non-Acute Institutional Stay (SKILLED_NURSING_FACILITY): Payer: Medicare Other | Admitting: Nurse Practitioner

## 2013-09-13 ENCOUNTER — Encounter: Payer: Self-pay | Admitting: Nurse Practitioner

## 2013-09-13 DIAGNOSIS — F03918 Unspecified dementia, unspecified severity, with other behavioral disturbance: Secondary | ICD-10-CM

## 2013-09-13 DIAGNOSIS — F411 Generalized anxiety disorder: Secondary | ICD-10-CM

## 2013-09-13 DIAGNOSIS — R609 Edema, unspecified: Secondary | ICD-10-CM

## 2013-09-13 DIAGNOSIS — D649 Anemia, unspecified: Secondary | ICD-10-CM

## 2013-09-13 DIAGNOSIS — F0391 Unspecified dementia with behavioral disturbance: Secondary | ICD-10-CM

## 2013-09-13 DIAGNOSIS — R0989 Other specified symptoms and signs involving the circulatory and respiratory systems: Secondary | ICD-10-CM

## 2013-09-13 NOTE — Progress Notes (Signed)
Patient ID: [name], female   DOB: [birth year], 80 y.o.   MRN: [id number]    Nursing Home [facility] ([facility]  PCP: Unice Cobble, MD  Allergies  Allergen Reactions  . Morphine Sulfate     REACTION:difficulty breathing  . Penicillins     REACTION: difficulty breathing  . Aminophylline   . Clarithromycin     REACTION: unspecified  . Oxycodone Hcl     REACTION: unspecified  . Sulfamethoxazole     REACTION: unspecified  . Sulfonamide Derivatives     REACTION: unspecified    Chief Complaint  Patient presents with  . Medical Managment of Chronic Issues    HPI:  80 year old female with pmh of asthma, depression, OA, PMR, dementia who is a long term resident of [facility]. Pt is being seen today for AMS and routine follow up; staff reports pts anxiety had been worse and frequently yelling out but with recent changes to medication this had improved; however in the past 24-48 hours she has become more lethargic and not eating or drinking as much; staff reports increase congestion but not coughing anything up. ST has been monitoring her for dysphagia and question asp pne. No fever or chills noted   Review of Systems:  Unable to obtain  Past Medical History  Diagnosis Date  . Asthma   . Depression   . Diverticulosis   . Polymyalgia rheumatica 2008  . Depression   . Osteoarthritis   . Retinal detachment     left eye S/P silicone injection  . Myocardial infarct   . Pneumonia 11/2011    treated as OP  . Breast cancer 1987   Past Surgical History  Procedure Laterality Date  . Cesarean section      x3  . Cholecystectomy    . Biopsy thyroid  04-2004    needle  . Mrsa cellulitis  02-2006  . Shoulder surgery  11-2003  . Colonoscopy w/ polypectomy    . Partial gastrectomy  1977    [place], [place]  . Double mastectomy  1987    no post op therapy   Social History:   reports that she has never smoked. She does not have  any smokeless tobacco history on file. She reports that she does not drink alcohol or use illicit drugs.  Family History  Problem Relation Age of Onset  . Hypertension Mother   . Cancer Sister     intestinal  . Breast cancer Daughter   . Diabetes Neg Hx   . Stroke Neg Hx     Medications: Patient's Medications  New Prescriptions   No medications on file  Previous Medications   ACETAMINOPHEN (TYLENOL) 325 MG TABLET    Take 2 tablets (650 mg total) by mouth every 6 (six) hours as needed.   ASPIRIN 81 MG CHEWABLE TABLET    Chew 81 mg by mouth daily.   CHOLECALCIFEROL (VITAMIN D3) 1000 UNITS CAPS    Take 1 capsule by mouth daily.   DONEPEZIL (ARICEPT) 5 MG TABLET    Take 5 mg by mouth at bedtime.   FERROUS SULFATE 325 (65 FE) MG TABLET    Take 325 mg by mouth every morning.   FUROSEMIDE (LASIX) 20 MG TABLET    Take 10 mg by mouth daily.   HYDROCODONE-ACETAMINOPHEN (NORCO/VICODIN) 5-325 MG PER TABLET    1/2 by mouth every 6 hours as needed DO NOT EXCEED 4 GM OF  TYLENOL IN 24 HOURS   LORAZEPAM (ATIVAN) 0.5 MG TABLET    Take one tablet by mouth every 6 hours as needed; Take one tablet by mouth three times daily   METHADONE (DOLOPHINE) 5 MG TABLET    Take 1 tablet (5 mg total) by mouth daily.   OMEPRAZOLE (PRILOSEC) 20 MG CAPSULE    Take 20 mg by mouth daily.   SERTRALINE HCL (ZOLOFT PO)    Take 75 mg by mouth daily.   TRAZODONE (DESYREL) 50 MG TABLET    Take 50 mg by mouth at bedtime.  Modified Medications   No medications on file  Discontinued Medications   FLUOXETINE (PROZAC) 10 MG CAPSULE    Take 30 mg by mouth daily. Take 3 capsules by mouth daily.     Physical Exam: Physical Exam  Constitutional:  Thin ill appearing female in NAD  HENT:  Mouth/Throat: Oropharynx is clear and moist. No oropharyngeal exudate.  Eyes: Conjunctivae and EOM are normal. Pupils are equal, round, and reactive to light.  Neck: Normal range of motion. Neck supple.  Cardiovascular: Normal rate, regular  rhythm and normal heart sounds.   Pulmonary/Chest: Effort normal. No respiratory distress. She has no wheezes.  Diminished breath sounds  Abdominal: Soft. Bowel sounds are normal.  Musculoskeletal: She exhibits no edema and no tenderness.  Skin: Skin is warm and dry. She is not diaphoretic.  Psychiatric:  Lethargic      Filed Vitals:   09/13/13 1636  BP: 112/78  Pulse: 68  Temp: 98.1 F (36.7 C)  Resp: 20      Labs reviewed: Basic Metabolic Panel:  Recent Labs  07/06/13 0750 07/07/13 0406 07/08/13 0344  NA 135 135 134*  K 4.6 4.7 4.7  CL 100 103 102  CO2 26 24 23   GLUCOSE 96 89 83  BUN 26* 18 15  CREATININE 1.70* 1.32* 1.15*  CALCIUM 9.2 8.6 8.6   Liver Function Tests: No results found for this basename: AST, ALT, ALKPHOS, BILITOT, PROT, ALBUMIN,  in the last 8760 hours No results found for this basename: LIPASE, AMYLASE,  in the last 8760 hours No results found for this basename: AMMONIA,  in the last 8760 hours CBC:  Recent Labs  07/06/13 0750 07/07/13 0406 07/08/13 0344  WBC 3.0* 2.9* 2.7*  NEUTROABS 1.8  --   --   HGB 9.8* 9.5* 8.9*  HCT 30.6* 29.0* 26.6*  MCV 86.9 87.3 85.3  PLT 185 176 162   Cardiac Enzymes:  Recent Labs  07/06/13 0750  TROPONINI <0.30   BNP: No components found with this basename: POCBNP,  CBG: No results found for this basename: GLUCAP,  in the last 8760 hours TSH: No results found for this basename: TSH,  in the last 8760 hours A1C: Lab Results  Component Value Date   HGBA1C 5.3 04/27/2012   CMP with Estimated GFR    Result: 08/09/2013 10:14 AM   ( Status: F )     C Sodium 138     135-145 mEq/L SLN   Potassium 3.5     3.5-5.3 mEq/L SLN   Chloride 101     96-112 mEq/L SLN   CO2 26     19-32 mEq/L SLN   Glucose 101   H 70-99 mg/dL SLN   BUN 13     6-23 mg/dL SLN   Creatinine 0.74     0.50-1.10 mg/dL SLN   Bilirubin, Total 0.5     0.3-1.2 mg/dL SLN  Alkaline Phosphatase 52     39-117 U/L SLN   AST/SGOT 12       0-37 U/L SLN   ALT/SGPT 11     0-35 U/L SLN   Total Protein 7.1     6.0-8.3 g/dL SLN   Albumin 3.1   L 3.5-5.2 g/dL SLN   Calcium 8.7     8.4-10.5 mg/dL SLN   Est GFR, African American 80      mL/min SLN   Est GFR, NonAfrican American 69      mL/min SLN C CBC NO Diff (Complete Blood Count)    Result: 08/09/2013 10:33 AM   ( Status: F )       WBC 2.8   L 4.0-10.5 K/uL SLN   RBC 3.66   L 3.87-5.11 MIL/uL SLN   Hemoglobin 10.5   L 12.0-15.0 g/dL SLN   Hematocrit 31.1   L 36.0-46.0 % SLN   MCV 85.0     78.0-100.0 fL SLN   MCH 28.7     26.0-34.0 pg SLN   MCHC 33.8     30.0-36.0 g/dL SLN   RDW 15.3     11.5-15.5 % SLN   Platelet Count 226   CBC NO Diff (Complete Blood Count)    Result: 08/25/2013 11:08 AM   ( Status: F )     C WBC 2.8   L 4.0-10.5 K/uL SLN   RBC 3.38   L 3.87-5.11 MIL/uL SLN   Hemoglobin 9.6   L 12.0-15.0 g/dL SLN   Hematocrit 28.7   L 36.0-46.0 % SLN   MCV 84.9     78.0-100.0 fL SLN   MCH 28.4     26.0-34.0 pg SLN   MCHC 33.4     30.0-36.0 g/dL SLN   RDW 15.5     11.5-15.5 % SLN   Platelet Count 204     150-400 K/uL SLN   Comprehensive Metabolic Panel    Result: 08/25/2013 10:20 AM   ( Status: F )       Sodium 137     135-145 mEq/L SLN   Potassium 4.2     3.5-5.3 mEq/L SLN   Chloride 100     96-112 mEq/L SLN   CO2 30     19-32 mEq/L SLN   Glucose 80     70-99 mg/dL SLN   BUN 25   H 6-23 mg/dL SLN   Creatinine 0.86     0.50-1.10 mg/dL SLN   Bilirubin, Total 0.3     0.3-1.2 mg/dL SLN   Alkaline Phosphatase 59     39-117 U/L SLN   AST/SGOT 12     0-37 U/L SLN   ALT/SGPT 10     0-35 U/L SLN   Total Protein 6.9     6.0-8.3 g/dL SLN   Albumin 2.8   L 3.5-5.2 g/dL SLN   Calcium 8.7     8.4-10.5 mg/dL SLN   TSH, Ultrasensitive    Result: 08/25/2013 11:31 AM   ( Status: F )       TSH 2.458     0.350-4.500 uIU/mL SLN    Assessment/Plan 1. EDEMA- LOCALIZED -stable; will dc lasix at this time due to pts poor PO intake  2. Anxiety state, unspecified -improved  with recent changes in medications (adding zoloft) -will hold all scheduled ativan due to increase in lethargy   3. ANEMIA NOS -stable  4. Dementia with behavioral disturbance -conts on aricept  5. Chest congestion  and lethargy  Speech following pt concern with possible asp pne -pt nowwith increase in lethargy, decrease PO intake and chest congestion -- recnetly pt has increase in ativan due to worsening agitation but was found to have UTI; question if agitation was not related to infection vs dementia related anxiety; at this time will stop scheduled ativan (pt has PRN if needed) -will stop lasix due to pt appearing dry and have staff push fluids -will have staff encourage PO intake -will get BMP and CBC now -will get cxr -duonebs q 6 hours for 3 days  -vs with pulse Ox q shift and to call provider for changes as neede

## 2013-09-14 ENCOUNTER — Non-Acute Institutional Stay (SKILLED_NURSING_FACILITY): Payer: Medicare Other | Admitting: Internal Medicine

## 2013-09-14 DIAGNOSIS — Y95 Nosocomial condition: Principal | ICD-10-CM

## 2013-09-14 DIAGNOSIS — J189 Pneumonia, unspecified organism: Secondary | ICD-10-CM

## 2013-09-23 NOTE — Progress Notes (Signed)
Patient ID: [name], female   DOB: Mar 13, 1916, 80 y.o.   MRN: [id number]                PROGRESS NOTE  DATE:  09/14/2013    FACILITY: [facility]    LEVEL OF CARE:   SNF   Acute Visit   CHIEF COMPLAINT:  Follow up right lower lobe pneumonia.    HISTORY OF PRESENT ILLNESS:  This patient was seen by our service two days ago.  I do not have much information.  However, a chest x-ray was ordered suggesting a modest right lower lobe pneumonia.  She was started on Avelox.  Lab work was also done, although I do not see these results.  This is a patient with some degree of preexisting dementia who suffered a fall in late October.  She had a right temporal subdural hematoma as well as a small amount of subarachnoid blood.  She obviously had some deterioration in her mental status.  She could not be cared for at her assisted living.  Therefore, she was admitted here.  In this facility, she is restless and agitated most of the time, yells repetitively.  There is some degree of underlying psychosis in her thought content, managed by Psychiatry.    PHYSICAL EXAMINATION:   GENERAL APPEARANCE:  The patient is not nearly as restless and agitated as I am used to seeing her.  She probably also has vocal laryngitis as her speech is almost inaudible.  Nevertheless, she does not appear to be in any distress.   CHEST/RESPIRATORY:  Shallow, but otherwise clear air entry bilaterally.   CARDIOVASCULAR:  CARDIAC:  Perhaps some degree of volume contraction.  Heart sounds are normal.  There are no murmurs.   EDEMA/VARICOSITIES:  Extremities:  Venous stasis, some edema.  However, this has not changed.    ASSESSMENT/PLAN:  Right lower lobe pneumonia, question on the background of a viral syndrome.  She does not appear to be acutely ill.   However, she certainly has had a change in her usual behavior.  I note that her Lasix was discontinued and push fluids was suggested.  I certainly agree with this.  DuoNebs  were also ordered.  This may have been an acute aspiration pneumonitis.  However, the suggestion is that this was a partial consolidation.      CPT CODE: 87867

## 2013-10-04 ENCOUNTER — Non-Acute Institutional Stay (SKILLED_NURSING_FACILITY): Payer: Medicare Other | Admitting: Nurse Practitioner

## 2013-10-04 ENCOUNTER — Other Ambulatory Visit: Payer: Self-pay | Admitting: *Deleted

## 2013-10-04 DIAGNOSIS — D649 Anemia, unspecified: Secondary | ICD-10-CM

## 2013-10-04 DIAGNOSIS — F03918 Unspecified dementia, unspecified severity, with other behavioral disturbance: Secondary | ICD-10-CM

## 2013-10-04 DIAGNOSIS — G894 Chronic pain syndrome: Secondary | ICD-10-CM

## 2013-10-04 DIAGNOSIS — R609 Edema, unspecified: Secondary | ICD-10-CM

## 2013-10-04 DIAGNOSIS — F411 Generalized anxiety disorder: Secondary | ICD-10-CM

## 2013-10-04 DIAGNOSIS — F0391 Unspecified dementia with behavioral disturbance: Secondary | ICD-10-CM

## 2013-10-04 DIAGNOSIS — F29 Unspecified psychosis not due to a substance or known physiological condition: Secondary | ICD-10-CM

## 2013-10-04 DIAGNOSIS — I4891 Unspecified atrial fibrillation: Secondary | ICD-10-CM

## 2013-10-04 MED ORDER — METHADONE HCL 5 MG PO TABS: 5.0000 mg | ORAL_TABLET | Freq: Every day | ORAL | Status: DC

## 2013-10-04 NOTE — Progress Notes (Signed)
Patient ID: [name], female   DOB: 1916-02-03, 80 y.o.   MRN: [id number]    Nursing Home Location:  [facility] of Service: SNF (31)  PCP: Unice Cobble, MD  Allergies  Allergen Reactions  . Morphine Sulfate     REACTION:difficulty breathing  . Penicillins     REACTION: difficulty breathing  . Aminophylline   . Clarithromycin     REACTION: unspecified  . Oxycodone Hcl     REACTION: unspecified  . Sulfamethoxazole     REACTION: unspecified  . Sulfonamide Derivatives     REACTION: unspecified    Chief Complaint  Patient presents with  . Medical Managment of Chronic Issues    HPI:  80 year old female with pmh of asthma, depression, OA, PMR, dementia who is a long term resident of [facility]. Pt being seen today for routine follow up; In the past month pt has had PNA and was treated with avelox; lasix dc and fluids pushed; pt also had altered LOC therefore scheduled ativan was held; once PNA resolved and pt became more alerts behaviors became a lot worse; freq crying out and yelling spells, pt was also not sleeping, therefore Risperdal 0.25 mg BID was added; pt has done well with this per nursing, sleeping at night with less behaviors during the day; still will cry out but is less often and not as intense   Review of Systems:  Unable to obtain  Past Medical History  Diagnosis Date  . Asthma   . Depression   . Diverticulosis   . Polymyalgia rheumatica 2008  . Depression   . Osteoarthritis   . Retinal detachment     left eye S/P silicone injection  . Myocardial infarct   . Pneumonia 11/2011    treated as OP  . Breast cancer 1987   Past Surgical History  Procedure Laterality Date  . Cesarean section      x3  . Cholecystectomy    . Biopsy thyroid  04-2004    needle  . Mrsa cellulitis  02-2006  . Shoulder surgery  11-2003  . Colonoscopy w/ polypectomy    . Partial gastrectomy  1977    [place], [place]  . Double mastectomy  1987    no  post op therapy   Social History:   reports that she has never smoked. She does not have any smokeless tobacco history on file. She reports that she does not drink alcohol or use illicit drugs.  Family History  Problem Relation Age of Onset  . Hypertension Mother   . Cancer Sister     intestinal  . Breast cancer Daughter   . Diabetes Neg Hx   . Stroke Neg Hx     Medications: Patient's Medications  New Prescriptions   No medications on file  Previous Medications   ACETAMINOPHEN (TYLENOL) 325 MG TABLET    Take 2 tablets (650 mg total) by mouth every 6 (six) hours as needed.   ASPIRIN 81 MG CHEWABLE TABLET    Chew 81 mg by mouth daily.   CHOLECALCIFEROL (VITAMIN D3) 1000 UNITS CAPS    Take 1 capsule by mouth daily.   DONEPEZIL (ARICEPT) 5 MG TABLET    Take 10 mg by mouth at bedtime.    FERROUS SULFATE 325 (65 FE) MG TABLET    Take 325 mg by mouth every morning.   HYDROCODONE-ACETAMINOPHEN (NORCO/VICODIN) 5-325 MG PER TABLET    1/2 by mouth every 6 hours as  needed DO NOT EXCEED 4 GM OF TYLENOL IN 24 HOURS   METHADONE (DOLOPHINE) 5 MG TABLET    Take 1 tablet (5 mg total) by mouth daily.   OMEPRAZOLE (PRILOSEC) 20 MG CAPSULE    Take 20 mg by mouth daily.   RISPERIDONE (RISPERDAL) 0.25 MG TABLET    Take 0.25 mg by mouth 2 (two) times daily.   SERTRALINE HCL (ZOLOFT PO)    Take 75 mg by mouth daily.   TRAZODONE (DESYREL) 50 MG TABLET    Take 50 mg by mouth at bedtime.  Modified Medications   Modified Medication Previous Medication   LORAZEPAM (ATIVAN) 0.5 MG TABLET LORazepam (ATIVAN) 0.5 MG tablet      Take one tablet by mouth every 6 hours as needed    Take one tablet by mouth every 6 hours as needed; Take one tablet by mouth three times daily  Discontinued Medications   FUROSEMIDE (LASIX) 20 MG TABLET    Take 10 mg by mouth daily.     Physical Exam: Physical Exam  Constitutional:   Frail appearing female in NAD  HENT:  Head: Normocephalic and atraumatic.  Right Ear: External  ear normal.  Left Ear: External ear normal.  Nose: Nose normal.  Mouth/Throat: Oropharynx is clear and moist. No oropharyngeal exudate.  Eyes: Conjunctivae and EOM are normal. Pupils are equal, round, and reactive to light.  Neck: Normal range of motion. Neck supple.  Cardiovascular: Normal rate, regular rhythm and normal heart sounds.   Pulmonary/Chest: Effort normal and breath sounds normal. No respiratory distress. She has no wheezes.  Abdominal: Soft. Bowel sounds are normal.  Musculoskeletal: She exhibits no edema and no tenderness.  Neurological: She is alert.  Skin: Skin is warm and dry. She is not diaphoretic.    Filed Vitals:   10/04/13 1319  BP: 123/71  Pulse: 73  Temp: 97.4 F (36.3 C)  Resp: 18  Weight: 109 lb (49.442 kg)      Labs reviewed: Basic Metabolic Panel:  Recent Labs  07/06/13 0750 07/07/13 0406 07/08/13 0344  NA 135 135 134*  K 4.6 4.7 4.7  CL 100 103 102  CO2 26 24 23   GLUCOSE 96 89 83  BUN 26* 18 15  CREATININE 1.70* 1.32* 1.15*  CALCIUM 9.2 8.6 8.6   Liver Function Tests: No results found for this basename: AST, ALT, ALKPHOS, BILITOT, PROT, ALBUMIN,  in the last 8760 hours No results found for this basename: LIPASE, AMYLASE,  in the last 8760 hours No results found for this basename: AMMONIA,  in the last 8760 hours CBC:  Recent Labs  07/06/13 0750 07/07/13 0406 07/08/13 0344  WBC 3.0* 2.9* 2.7*  NEUTROABS 1.8  --   --   HGB 9.8* 9.5* 8.9*  HCT 30.6* 29.0* 26.6*  MCV 86.9 87.3 85.3  PLT 185 176 162   CBC NO Diff (Complete Blood Count)  Result: 08/09/2013 10:33 AM ( Status: F )  WBC 2.8 L 4.0-10.5 K/uL SLN  RBC 3.66 L 3.87-5.11 MIL/uL SLN  Hemoglobin 10.5 L 12.0-15.0 g/dL SLN  Hematocrit 31.1 L 36.0-46.0 % SLN  MCV 85.0 78.0-100.0 fL SLN  MCH 28.7 26.0-34.0 pg SLN  MCHC 33.8 30.0-36.0 g/dL SLN  RDW 15.3 11.5-15.5 % SLN  Platelet Count 226  CBC NO Diff (Complete Blood Count)  Result: 08/25/2013 11:08 AM ( Status: F ) C    WBC 2.8 L 4.0-10.5 K/uL SLN  RBC 3.38 L 3.87-5.11 MIL/uL SLN  Hemoglobin 9.6 L  12.0-15.0 g/dL SLN  Hematocrit 28.7 L 36.0-46.0 % SLN  MCV 84.9 78.0-100.0 fL SLN  MCH 28.4 26.0-34.0 pg SLN  MCHC 33.4 30.0-36.0 g/dL SLN  RDW 15.5 11.5-15.5 % SLN  Platelet Count 204 150-400 K/uL SLN  Comprehensive Metabolic Panel  Result: 17/51/0258 10:20 AM ( Status: F )  Sodium 137 135-145 mEq/L SLN  Potassium 4.2 3.5-5.3 mEq/L SLN  Chloride 100 96-112 mEq/L SLN  CO2 30 19-32 mEq/L SLN  Glucose 80 70-99 mg/dL SLN  BUN 25 H 6-23 mg/dL SLN  Creatinine 0.86 0.50-1.10 mg/dL SLN  Bilirubin, Total 0.3 0.3-1.2 mg/dL SLN  Alkaline Phosphatase 59 39-117 U/L SLN  AST/SGOT 12 0-37 U/L SLN  ALT/SGPT 10 0-35 U/L SLN  Total Protein 6.9 6.0-8.3 g/dL SLN  Albumin 2.8 L 3.5-5.2 g/dL SLN  Calcium 8.7 8.4-10.5 mg/dL SLN  TSH, Ultrasensitive  Result: 08/25/2013 11:31 AM ( Status: F )  TSH 2.458 0.350-4.500 uIU/mL    Assessment/Plan 1. Dementia with behavioral disturbance -currently on Aricept 10 mg qhs -will start namenda titration to 20 mg XR - conts on Seroquel and Risperdal for behaviors   2. Anxiety state, unspecified -conts on zoloft and ativan as needed  3. Atrial fibrillation -rate controlled  4. ANEMIA NOS -cbc stable; will cont to monitor  5. EDEMA- LOCALIZED -lasix was stopped; edema is minimal; will cont off lasix   6. Chronic pain syndrome -on methadone 5 mg daily  7. Psychosis -on seroquel and risperal; behaviors have improved but still with freq crying out and fearful episodes; psych following; will not add any additional medications at this time but will cont to monitor

## 2013-10-14 ENCOUNTER — Other Ambulatory Visit: Payer: Self-pay | Admitting: *Deleted

## 2013-10-14 MED ORDER — METHADONE HCL 5 MG PO TABS: ORAL_TABLET | ORAL | Status: AC

## 2013-10-14 NOTE — Telephone Encounter (Signed)
[facility] 

## 2013-11-01 ENCOUNTER — Non-Acute Institutional Stay (SKILLED_NURSING_FACILITY): Payer: Medicare Other | Admitting: Nurse Practitioner

## 2013-11-01 DIAGNOSIS — F03918 Unspecified dementia, unspecified severity, with other behavioral disturbance: Secondary | ICD-10-CM

## 2013-11-01 DIAGNOSIS — K219 Gastro-esophageal reflux disease without esophagitis: Secondary | ICD-10-CM

## 2013-11-01 DIAGNOSIS — F411 Generalized anxiety disorder: Secondary | ICD-10-CM

## 2013-11-01 DIAGNOSIS — K5900 Constipation, unspecified: Secondary | ICD-10-CM

## 2013-11-01 DIAGNOSIS — F0391 Unspecified dementia with behavioral disturbance: Secondary | ICD-10-CM

## 2013-11-01 DIAGNOSIS — D649 Anemia, unspecified: Secondary | ICD-10-CM

## 2013-11-01 NOTE — Progress Notes (Signed)
Patient ID: [name], female   DOB: Jan 24, 1916, 80 y.o.   MRN: [id number]    Nursing Home Location:  [place] of Service: SNF (31)  PCP: Unice Cobble, MD  Allergies  Allergen Reactions  . Morphine Sulfate     REACTION:difficulty breathing  . Penicillins     REACTION: difficulty breathing  . Aminophylline   . Clarithromycin     REACTION: unspecified  . Oxycodone Hcl     REACTION: unspecified  . Sulfamethoxazole     REACTION: unspecified  . Sulfonamide Derivatives     REACTION: unspecified    Chief Complaint  Patient presents with  . Medical Managment of Chronic Issues    HPI:  80 year old female with pmh of asthma, depression, OA, PMR, dementia who is a long term resident of [facility]. Pt being seen today for routine follow up.  In the past month pt has been treated for another pneumonia; however she is now hospice pt and son does not want her treated with anymore antibiotics; per pts prior request will not hospitalized and no gtube. Nursing reports pt with increase in crying out and complaint s of pain but can not verbalize where this is coming from ? Shoulder or legs or abdominal due to constipation- BMs not regular.   Review of Systems:  Unable to obtain  Past Medical History  Diagnosis Date  . Asthma   . Depression   . Diverticulosis   . Polymyalgia rheumatica 2008  . Depression   . Osteoarthritis   . Retinal detachment     left eye S/P silicone injection  . Myocardial infarct   . Pneumonia 11/2011    treated as OP  . Breast cancer 1987   Past Surgical History  Procedure Laterality Date  . Cesarean section      x3  . Cholecystectomy    . Biopsy thyroid  04-2004    needle  . Mrsa cellulitis  02-2006  . Shoulder surgery  11-2003  . Colonoscopy w/ polypectomy    . Partial gastrectomy  1977    [place], [place]  . Double mastectomy  1987    no post op therapy   Social History:   reports that she has never smoked. She  does not have any smokeless tobacco history on file. She reports that she does not drink alcohol or use illicit drugs.  Family History  Problem Relation Age of Onset  . Hypertension Mother   . Cancer Sister     intestinal  . Breast cancer Daughter   . Diabetes Neg Hx   . Stroke Neg Hx     Medications: Patient's Medications  New Prescriptions   No medications on file  Previous Medications   ACETAMINOPHEN (TYLENOL) 325 MG TABLET    Take 2 tablets (650 mg total) by mouth every 6 (six) hours as needed.   HYDROCODONE-ACETAMINOPHEN (NORCO/VICODIN) 5-325 MG PER TABLET    1/2 by mouth every 6 hours as needed DO NOT EXCEED 4 GM OF TYLENOL IN 24 HOURS   LORAZEPAM (ATIVAN) 0.5 MG TABLET    Take one tablet by mouth every 6 hours as needed   METHADONE (DOLOPHINE) 5 MG TABLET    Take one tablet by mouth three times daily for pain   OMEPRAZOLE (PRILOSEC) 20 MG CAPSULE    Take 20 mg by mouth daily.   SERTRALINE HCL (ZOLOFT PO)    Take 75 mg by mouth daily.   TRAZODONE (DESYREL)  50 MG TABLET    Take 50 mg by mouth at bedtime.  Modified Medications   No medications on file  Discontinued Medications   ASPIRIN 81 MG CHEWABLE TABLET    Chew 81 mg by mouth daily.   CHOLECALCIFEROL (VITAMIN D3) 1000 UNITS CAPS    Take 1 capsule by mouth daily.   DONEPEZIL (ARICEPT) 5 MG TABLET    Take 10 mg by mouth at bedtime.    FERROUS SULFATE 325 (65 FE) MG TABLET    Take 325 mg by mouth every morning.   RISPERIDONE (RISPERDAL) 0.25 MG TABLET    Take 0.25 mg by mouth 2 (two) times daily.     Physical Exam:  Filed Vitals:   11/01/13 1234  BP: 121/80  Pulse: 92  Temp: 95 F (35 C)  Resp: 18    Physical Exam  Constitutional:   Frail appearing female in NAD  HENT:  Head: Normocephalic and atraumatic.  Right Ear: External ear normal.  Left Ear: External ear normal.  Nose: Nose normal.  Mouth/Throat: Oropharynx is clear and moist. No oropharyngeal exudate.  Eyes: Conjunctivae and EOM are normal. Pupils  are equal, round, and reactive to light.  Neck: Normal range of motion. Neck supple.  Cardiovascular: Normal rate, regular rhythm and normal heart sounds.   Pulmonary/Chest: Effort normal and breath sounds normal. No respiratory distress. She has no wheezes.  Abdominal: Soft. Bowel sounds are normal. There is no tenderness.  Musculoskeletal: She exhibits no edema and no tenderness.  Neurological: She is alert.  Skin: Skin is warm and dry. She is not diaphoretic.  Psychiatric:  Georg Ruddle out frequently during exam     Labs reviewed: Basic Metabolic Panel:  Recent Labs  07/06/13 0750 07/07/13 0406 07/08/13 0344  NA 135 135 134*  K 4.6 4.7 4.7  CL 100 103 102  CO2 26 24 23   GLUCOSE 96 89 83  BUN 26* 18 15  CREATININE 1.70* 1.32* 1.15*  CALCIUM 9.2 8.6 8.6   Liver Function Tests: No results found for this basename: AST, ALT, ALKPHOS, BILITOT, PROT, ALBUMIN,  in the last 8760 hours No results found for this basename: LIPASE, AMYLASE,  in the last 8760 hours No results found for this basename: AMMONIA,  in the last 8760 hours CBC:  Recent Labs  07/06/13 0750 07/07/13 0406 07/08/13 0344  WBC 3.0* 2.9* 2.7*  NEUTROABS 1.8  --   --   HGB 9.8* 9.5* 8.9*  HCT 30.6* 29.0* 26.6*  MCV 86.9 87.3 85.3  PLT 185 176 162   Cardiac Enzymes:  Recent Labs  07/06/13 0750  TROPONINI <0.30   BNP: No components found with this basename: POCBNP,  CBG: No results found for this basename: GLUCAP,  in the last 8760 hours TSH: No results found for this basename: TSH,  in the last 8760 hours A1C: Lab Results  Component Value Date   HGBA1C 5.3 04/27/2012    Assessment/Plan 1. Dementia with behavioral disturbance -advanced; medications stopped at this time  2. ANEMIA NOS -medications minimized and iron stopped due to hospice status  3. Anxiety state, unspecified -worse today, staff reports this has been better controlled in the past but question if it is due to increase in pain    -conts on zoloft   4. GERD (gastroesophageal reflux disease) -will cont omeprazole   5. Unspecified constipation -will add colace 100 mg BID for constipation    6. Chronic pain -on methadone but still requiring frequent PRNs -will increase  PRN dose to 1 tablet every 6 hours as needed for break through pain

## 2013-12-08 DEATH — deceased
